# Patient Record
Sex: Female | Born: 1970 | Race: Black or African American | Hispanic: No | Marital: Married | Smoking: Never smoker
Health system: Southern US, Community
[De-identification: ages and names within clinical notes are randomized; demographics above are authoritative.]

## PROBLEM LIST (undated history)

## (undated) DIAGNOSIS — K219 Gastro-esophageal reflux disease without esophagitis: Secondary | ICD-10-CM

## (undated) DIAGNOSIS — R51 Headache: Secondary | ICD-10-CM

## (undated) DIAGNOSIS — F329 Major depressive disorder, single episode, unspecified: Secondary | ICD-10-CM

## (undated) DIAGNOSIS — F32A Depression, unspecified: Secondary | ICD-10-CM

## (undated) DIAGNOSIS — I10 Essential (primary) hypertension: Secondary | ICD-10-CM

## (undated) DIAGNOSIS — F419 Anxiety disorder, unspecified: Secondary | ICD-10-CM

## (undated) DIAGNOSIS — R519 Headache, unspecified: Secondary | ICD-10-CM

## (undated) HISTORY — PX: ABDOMINAL HYSTERECTOMY: SHX81

---

## 2001-10-25 ENCOUNTER — Emergency Department (HOSPITAL_COMMUNITY): Admission: EM | Admit: 2001-10-25 | Discharge: 2001-10-25 | Payer: Self-pay

## 2002-02-02 ENCOUNTER — Other Ambulatory Visit: Admission: RE | Admit: 2002-02-02 | Discharge: 2002-02-02 | Payer: Self-pay | Admitting: Obstetrics and Gynecology

## 2003-05-10 ENCOUNTER — Other Ambulatory Visit: Admission: RE | Admit: 2003-05-10 | Discharge: 2003-05-10 | Payer: Self-pay | Admitting: Obstetrics and Gynecology

## 2012-07-30 ENCOUNTER — Emergency Department (HOSPITAL_COMMUNITY)
Admission: EM | Admit: 2012-07-30 | Discharge: 2012-07-31 | Disposition: A | Discharge status: Home / Self Care | Payer: Managed Care, Other (non HMO) | Attending: Emergency Medicine | Admitting: Emergency Medicine

## 2012-07-30 ENCOUNTER — Encounter (HOSPITAL_COMMUNITY): Payer: Self-pay | Admitting: Emergency Medicine

## 2012-07-30 ENCOUNTER — Emergency Department (HOSPITAL_COMMUNITY): Payer: Managed Care, Other (non HMO)

## 2012-07-30 DIAGNOSIS — I10 Essential (primary) hypertension: Secondary | ICD-10-CM | POA: Insufficient documentation

## 2012-07-30 DIAGNOSIS — F329 Major depressive disorder, single episode, unspecified: Secondary | ICD-10-CM

## 2012-07-30 DIAGNOSIS — T424X1A Poisoning by benzodiazepines, accidental (unintentional), initial encounter: Secondary | ICD-10-CM

## 2012-07-30 DIAGNOSIS — T426X1A Poisoning by other antiepileptic and sedative-hypnotic drugs, accidental (unintentional), initial encounter: Secondary | ICD-10-CM | POA: Insufficient documentation

## 2012-07-30 DIAGNOSIS — T4275XA Adverse effect of unspecified antiepileptic and sedative-hypnotic drugs, initial encounter: Secondary | ICD-10-CM | POA: Insufficient documentation

## 2012-07-30 HISTORY — DX: Essential (primary) hypertension: I10

## 2012-07-30 LAB — RAPID URINE DRUG SCREEN, HOSP PERFORMED
Barbiturates: NOT DETECTED
Cocaine: NOT DETECTED
Opiates: NOT DETECTED
Tetrahydrocannabinol: NOT DETECTED

## 2012-07-30 LAB — COMPREHENSIVE METABOLIC PANEL
ALT: 14 U/L (ref 0–35)
Alkaline Phosphatase: 76 U/L (ref 39–117)
BUN: 12 mg/dL (ref 6–23)
CO2: 32 mEq/L (ref 19–32)
Calcium: 8.9 mg/dL (ref 8.4–10.5)
GFR calc Af Amer: >90 mL/min (ref >90)
GFR calc non Af Amer: >90 mL/min (ref >90)
Glucose, Bld: 81 mg/dL (ref 70–99)
Sodium: 140 mEq/L (ref 135–145)
Total Protein: 6.7 g/dL (ref 6.0–8.3)

## 2012-07-30 LAB — URINALYSIS, ROUTINE W REFLEX MICROSCOPIC
Nitrite: NEGATIVE
Specific Gravity, Urine: 1.021 (ref 1.005–1.030)
Urobilinogen, UA: 0.2 mg/dL (ref 0.0–1.0)
pH: 7 (ref 5.0–8.0)

## 2012-07-30 LAB — PREGNANCY, URINE: Preg Test, Ur: NEGATIVE

## 2012-07-30 LAB — ETHANOL: Alcohol, Ethyl (B): <11 mg/dL (ref 0–11)

## 2012-07-30 LAB — CBC
HCT: 42.1 % (ref 36.0–46.0)
Hemoglobin: 14.2 g/dL (ref 12.0–15.0)
MCH: 30.9 pg (ref 26.0–34.0)
MCHC: 33.7 g/dL (ref 30.0–36.0)
RBC: 4.59 MIL/uL (ref 3.87–5.11)

## 2012-07-30 LAB — TROPONIN I: Troponin I: <0.3 ng/mL (ref <0.30)

## 2012-07-30 MED ORDER — POTASSIUM CHLORIDE CRYS ER 20 MEQ PO TBCR
40.0000 meq | EXTENDED_RELEASE_TABLET | Freq: Once | ORAL | Status: AC
  Administered 2012-07-31: 40 meq via ORAL
  Filled 2012-07-30: qty 2
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
## 2012-07-30 NOTE — ED Notes (Signed)
YNW:GN56<OZ> Expected date:07/30/12<BR> Expected time: 8:23 PM<BR> Means of arrival:Ambulance<BR> Comments:<BR> overdose

## 2012-07-30 NOTE — ED Notes (Signed)
Pt to Radiology

## 2012-07-30 NOTE — ED Notes (Signed)
Per EMS, Pt took approximately 10, 0.5mg  Xanax. Pt stated she was not SI, only "trying to make the pain stop." Pt also finished half a bottle of wine and slept all day.  Pt's family members found her. Pt currently lethargic.

## 2012-07-31 LAB — TROPONIN I: Troponin I: <0.3 ng/mL (ref <0.30)

## 2012-07-31 MED ORDER — IBUPROFEN 200 MG PO TABS
400.0000 mg | ORAL_TABLET | Freq: Three times a day (TID) | ORAL | Status: DC | PRN
  Administered 2012-07-31: 400 mg via ORAL
  Filled 2012-07-31: qty 2

## 2012-07-31 MED ORDER — NICOTINE 21 MG/24HR TD PT24: 21.0000 mg | MEDICATED_PATCH | Freq: Every day | TRANSDERMAL | Status: DC | PRN

## 2012-07-31 MED ORDER — ACETAMINOPHEN 325 MG PO TABS: 650.0000 mg | ORAL_TABLET | ORAL | Status: DC | PRN

## 2012-07-31 MED ORDER — ONDANSETRON HCL 4 MG PO TABS: 4.0000 mg | ORAL_TABLET | Freq: Three times a day (TID) | ORAL | Status: DC | PRN

## 2012-07-31 MED ORDER — LORAZEPAM 1 MG PO TABS: 1.0000 mg | ORAL_TABLET | Freq: Three times a day (TID) | ORAL | Status: DC | PRN

## 2012-07-31 MED ORDER — MIRTAZAPINE 15 MG PO TBDP: 15.0000 mg | ORAL_TABLET | Freq: Every day | ORAL | Status: DC

## 2012-07-31 MED ORDER — ALUM & MAG HYDROXIDE-SIMETH 200-200-20 MG/5ML PO SUSP: 30.0000 mL | ORAL | Status: DC | PRN

## 2012-07-31 MED ORDER — ZOLPIDEM TARTRATE 5 MG PO TABS: 5.0000 mg | ORAL_TABLET | Freq: Every evening | ORAL | Status: DC | PRN

## 2012-07-31 NOTE — ED Notes (Signed)
Pt discharged

## 2012-07-31 NOTE — ED Provider Notes (Signed)
41 yo F, c/o OD xanax because she was "depressed."  Initially also c/o CP described as "my heart breaking." Normal EKG, normal troponin x2.  VS remain stable.  Medically cleared to go to psych holding unit.  Holding orders written.  Telepsych consult ordered.     Laray Anger, DO 07/31/12 0600

## 2012-07-31 NOTE — ED Notes (Signed)
Pt waiting for ride to arrive. Will discharge once he gets here.

## 2012-07-31 NOTE — ED Notes (Signed)
Telepsych results given to physician

## 2012-07-31 NOTE — ED Notes (Addendum)
Pt discharged home. Instructions reviewed. Pt verbalized understanding. Denies SI/HI. 

## 2012-07-31 NOTE — ED Provider Notes (Addendum)
Mary Huynh is a 41 y.o. female who is here for evaluation of a Xanax overdose. She appears depressed, she has poor insight. She appears to be a risk for discharge. Tele-psychiatric consultation has been ordered.  Flint Melter, MD 07/31/12 0915  She has been seen by Telepsych. Dr. Janalyn Rouse, states that she can be discharged with a prescription for Remeron and community mental health followup.  Flint Melter, MD 07/31/12 [phone]

## 2012-07-31 NOTE — ED Notes (Signed)
Pts belonging in locker room 41

## 2014-02-14 ENCOUNTER — Emergency Department (HOSPITAL_BASED_OUTPATIENT_CLINIC_OR_DEPARTMENT_OTHER): Payer: Managed Care, Other (non HMO)

## 2014-02-14 ENCOUNTER — Encounter (HOSPITAL_BASED_OUTPATIENT_CLINIC_OR_DEPARTMENT_OTHER): Payer: Self-pay | Admitting: Emergency Medicine

## 2014-02-14 ENCOUNTER — Emergency Department (HOSPITAL_BASED_OUTPATIENT_CLINIC_OR_DEPARTMENT_OTHER)
Admission: EM | Admit: 2014-02-14 | Discharge: 2014-02-14 | Disposition: A | Discharge status: Home / Self Care | Payer: Managed Care, Other (non HMO) | Attending: Emergency Medicine | Admitting: Emergency Medicine

## 2014-02-14 DIAGNOSIS — I10 Essential (primary) hypertension: Secondary | ICD-10-CM | POA: Insufficient documentation

## 2014-02-14 DIAGNOSIS — R079 Chest pain, unspecified: Secondary | ICD-10-CM | POA: Insufficient documentation

## 2014-02-14 DIAGNOSIS — F411 Generalized anxiety disorder: Secondary | ICD-10-CM | POA: Insufficient documentation

## 2014-02-14 DIAGNOSIS — Z79899 Other long term (current) drug therapy: Secondary | ICD-10-CM | POA: Insufficient documentation

## 2014-02-14 DIAGNOSIS — Z791 Long term (current) use of non-steroidal anti-inflammatories (NSAID): Secondary | ICD-10-CM | POA: Insufficient documentation

## 2014-02-14 DIAGNOSIS — R0602 Shortness of breath: Secondary | ICD-10-CM | POA: Insufficient documentation

## 2014-02-14 LAB — CBC WITH DIFFERENTIAL/PLATELET
Basophils Absolute: 0 10*3/uL (ref 0.0–0.1)
Basophils Relative: 1 % (ref 0–1)
Eosinophils Absolute: 0.1 10*3/uL (ref 0.0–0.7)
Eosinophils Relative: 2 % (ref 0–5)
HCT: 43.4 % (ref 36.0–46.0)
Hemoglobin: 14.8 g/dL (ref 12.0–15.0)
Lymphocytes Relative: 37 % (ref 12–46)
Lymphs Abs: 1.8 10*3/uL (ref 0.7–4.0)
MCH: 31.6 pg (ref 26.0–34.0)
MCHC: 34.1 g/dL (ref 30.0–36.0)
MCV: 92.7 fL (ref 78.0–100.0)
Monocytes Absolute: 0.5 10*3/uL (ref 0.1–1.0)
Monocytes Relative: 9 % (ref 3–12)
Neutro Abs: 2.5 10*3/uL (ref 1.7–7.7)
Neutrophils Relative %: 52 % (ref 43–77)
Platelets: 204 10*3/uL (ref 150–400)
RBC: 4.68 MIL/uL (ref 3.87–5.11)
RDW: 13 % (ref 11.5–15.5)
WBC: 4.9 10*3/uL (ref 4.0–10.5)

## 2014-02-14 LAB — BASIC METABOLIC PANEL
BUN: 17 mg/dL (ref 6–23)
CHLORIDE: 99 meq/L (ref 96–112)
CO2: 28 meq/L (ref 19–32)
CREATININE: 0.8 mg/dL (ref 0.50–1.10)
Calcium: 9.3 mg/dL (ref 8.4–10.5)
GFR calc Af Amer: >90 mL/min (ref >90)
GFR calc non Af Amer: >90 mL/min (ref >90)
GLUCOSE: 95 mg/dL (ref 70–99)
Potassium: 3.3 mEq/L — ABNORMAL LOW (ref 3.7–5.3)
Sodium: 141 mEq/L (ref 137–147)

## 2014-02-14 LAB — TROPONIN I: Troponin I: <0.3 ng/mL (ref <0.30)

## 2014-02-14 LAB — D-DIMER, QUANTITATIVE: D-Dimer, Quant: 0.32 ug/mL-FEU (ref 0.00–0.48)

## 2014-02-14 MED ORDER — MORPHINE SULFATE 4 MG/ML IJ SOLN
4.0000 mg | Freq: Once | INTRAMUSCULAR | Status: AC
  Administered 2014-02-14: 4 mg via INTRAVENOUS
  Filled 2014-02-14: qty 1

## 2014-02-14 MED ORDER — HYDROCODONE-ACETAMINOPHEN 5-325 MG PO TABS: 1.0000 | ORAL_TABLET | ORAL | Status: DC | PRN

## 2014-02-14 MED ORDER — ONDANSETRON HCL 4 MG/2ML IJ SOLN
4.0000 mg | Freq: Once | INTRAMUSCULAR | Status: AC
  Administered 2014-02-14: 4 mg via INTRAVENOUS
  Filled 2014-02-14: qty 2

## 2014-02-14 NOTE — ED Notes (Signed)
Pt amb to room 1 with quick steady gait in nad. Pt reports left chest, shoulder and arm have been "sore". Area is tender to palp, pain increases with movements, deep inspiration and positioning, ekg in progress while pt being triaged.

## 2014-02-14 NOTE — ED Provider Notes (Signed)
CSN: [id number]     Arrival date & time 02/14/14  1112 History   First MD Initiated Contact with Patient 02/14/14 1116     Chief Complaint  Patient presents with  . chest soreness      (Consider location/radiation/quality/duration/timing/severity/associated sxs/prior Treatment) HPI Comments: Pt states that she has had left sided cp without radiation for the last 4 days. Worse with movement and inspiration. Felt a little sob this morning. Pt states that she has similar episode about 3 years ago and she was diagnosed with anxiety after having a stress test. No vomiting, cough, diaphoresis. Hasn't taken any medications for this  The history is provided by the patient. No language interpreter was used.    Past Medical History  Diagnosis Date  . Hypertension    Past Surgical History  Procedure Laterality Date  . Abdominal hysterectomy     History reviewed. No pertinent family history. History  Substance Use Topics  . Smoking status: Never Smoker   . Smokeless tobacco: Never Used  . Alcohol Use: Yes     Comment: occasionally   OB History   Grav Para Term Preterm Abortions TAB SAB Ect Mult Living                 Review of Systems  Constitutional: Negative.   Respiratory: Positive for chest tightness.   Cardiovascular: Positive for chest pain.      Allergies  Review of patient's allergies indicates no known allergies.  Home Medications   Prior to Admission medications   Medication Sig Start Date End Date Taking? Authorizing Provider  ALPRAZolam Prudy Feeler) 0.5 MG tablet Take 0.5 mg by mouth at bedtime as needed. Anxiety    Historical Provider, MD  cyclobenzaprine (FLEXERIL) 10 MG tablet Take 10 mg by mouth 3 (three) times daily.    Historical Provider, MD  enalapril-hydrochlorothiazide (VASERETIC) 10-25 MG per tablet Take 1 tablet by mouth daily.    Historical Provider, MD  mirtazapine (REMERON SOLTAB) 15 MG disintegrating tablet Take 1 tablet (15 mg total) by mouth at  bedtime. 07/31/12   Flint Melter, MD  naproxen (NAPROSYN) 500 MG tablet Take 500 mg by mouth 2 (two) times daily with a meal.    Historical Provider, MD  phentermine 37.5 MG capsule Take 37.5 mg by mouth every morning.    Historical Provider, MD  tiZANidine (ZANAFLEX) 4 MG tablet Take 4 mg by mouth 3 (three) times daily.    Historical Provider, MD  Vitamin D, Ergocalciferol, (DRISDOL) 50000 UNITS CAPS Take 50,000 Units by mouth every 7 (seven) days.    Historical Provider, MD   BP 117/75  Pulse 80  Temp(Src) 98.4 F (36.9 C) (Oral)  Resp 18  Ht 5\' 8"  (1.727 m)  Wt 255 lb (115.667 kg)  BMI 38.78 kg/m2  SpO2 100% Physical Exam  Nursing note and vitals reviewed. Constitutional: She is oriented to person, place, and time. She appears well-developed and well-nourished.  HENT:  Head: Normocephalic and atraumatic.  Cardiovascular: Normal rate and regular rhythm.   Pulmonary/Chest: Effort normal.  Reproducible left sided cp  Neurological: She is alert and oriented to person, place, and time.  Skin: Skin is warm and dry.  Psychiatric: She has a normal mood and affect.    ED Course  Procedures (including critical care time) Labs Review Labs Reviewed  BASIC METABOLIC PANEL - Abnormal; Notable for the following:    Potassium 3.3 (*)    All other components within normal limits  CBC WITH  DIFFERENTIAL  TROPONIN I  D-DIMER, QUANTITATIVE    Imaging Review Dg Chest 2 View  02/14/2014   CLINICAL DATA:  Chest pain.  Shortness of breath.  EXAM: CHEST  2 VIEW  COMPARISON:  07/30/2012.  FINDINGS: Mediastinum hilar structures normal. Lungs are clear. Heart size normal. Stable mild elevation left hemidiaphragm. No acute bony abnormality.  IMPRESSION: No active cardiopulmonary disease.   Electronically Signed   By: Maisie Fushomas  Register   On: 02/14/2014 12:29     EKG Interpretation   Date/Time:  Monday February 14 2014 11:23:44 EDT Ventricular Rate:  80 PR Interval:  154 QRS Duration: 96 QT  Interval:  324 QTC Calculation: 373 R Axis:   -10 Text Interpretation:  Normal sinus rhythm Nonspecific T wave abnormality  Abnormal ECG No significant change was found Confirmed by [facility]  MD,  TREY ([id number]) on 02/14/2014 1:59:09 PM      MDM   Final diagnoses:  Chest pain    Pt states that she is no longer having pain and it just sore. Will trop negative. With symptoms going on this long don't think a second one needs to be done. Pt is okay to follow up with pcp for continued symptoms    Teressa LowerVrinda Sherlock Nancarrow, NP 02/14/14 1415

## 2014-02-14 NOTE — Discharge Instructions (Signed)
Chest Pain (Nonspecific) °It is often hard to give a specific diagnosis for the cause of chest pain. There is always a chance that your pain could be related to something serious, such as a heart attack or a blood clot in the lungs. You need to follow up with your caregiver for further evaluation. °CAUSES  °· Heartburn. °· Pneumonia or bronchitis. °· Anxiety or stress. °· Inflammation around your heart (pericarditis) or lung (pleuritis or pleurisy). °· A blood clot in the lung. °· A collapsed lung (pneumothorax). It can develop suddenly on its own (spontaneous pneumothorax) or from injury (trauma) to the chest. °· Shingles infection (herpes zoster virus). °The chest wall is composed of bones, muscles, and cartilage. Any of these can be the source of the pain. °· The bones can be bruised by injury. °· The muscles or cartilage can be strained by coughing or overwork. °· The cartilage can be affected by inflammation and become sore (costochondritis). °DIAGNOSIS  °Lab tests or other studies, such as X-rays, electrocardiography, stress testing, or cardiac imaging, may be needed to find the cause of your pain.  °TREATMENT  °· Treatment depends on what may be causing your chest pain. Treatment may include: °· Acid blockers for heartburn. °· Anti-inflammatory medicine. °· Pain medicine for inflammatory conditions. °· Antibiotics if an infection is present. °· You may be advised to change lifestyle habits. This includes stopping smoking and avoiding alcohol, caffeine, and chocolate. °· You may be advised to keep your head raised (elevated) when sleeping. This reduces the chance of acid going backward from your stomach into your esophagus. °· Most of the time, nonspecific chest pain will improve within 2 to 3 days with rest and mild pain medicine. °HOME CARE INSTRUCTIONS  °· If antibiotics were prescribed, take your antibiotics as directed. Finish them even if you start to feel better. °· For the next few days, avoid physical  activities that bring on chest pain. Continue physical activities as directed. °· Do not smoke. °· Avoid drinking alcohol. °· Only take over-the-counter or prescription medicine for pain, discomfort, or fever as directed by your caregiver. °· Follow your caregiver's suggestions for further testing if your chest pain does not go away. °· Keep any follow-up appointments you made. If you do not go to an appointment, you could develop lasting (chronic) problems with pain. If there is any problem keeping an appointment, you must call to reschedule. °SEEK MEDICAL CARE IF:  °· You think you are having problems from the medicine you are taking. Read your medicine instructions carefully. °· Your chest pain does not go away, even after treatment. °· You develop a rash with blisters on your chest. °SEEK IMMEDIATE MEDICAL CARE IF:  °· You have increased chest pain or pain that spreads to your arm, neck, jaw, back, or abdomen. °· You develop shortness of breath, an increasing cough, or you are coughing up blood. °· You have severe back or abdominal pain, feel nauseous, or vomit. °· You develop severe weakness, fainting, or chills. °· You have a fever. °THIS IS AN EMERGENCY. Do not wait to see if the pain will go away. Get medical help at once. Call your local emergency services (911 in [place] Do not drive yourself to the hospital. °MAKE SURE YOU:  °· Understand these instructions. °· Will watch your condition. °· Will get help right away if you are not doing well or get worse. °Document Released: 06/12/2005 Document Revised: 11/25/2011 Document Reviewed: 04/07/2008 °ExitCare® Patient Information ©2014 [employer]   [employer]. ° °

## 2014-02-17 NOTE — ED Provider Notes (Signed)
Medical screening examination/treatment/procedure(s) were performed by non-physician practitioner and as supervising physician I was immediately available for consultation/collaboration.   EKG Interpretation   Date/Time:  Monday February 14 2014 11:23:44 EDT Ventricular Rate:  80 PR Interval:  154 QRS Duration: 96 QT Interval:  324 QTC Calculation: 373 R Axis:   -10 Text Interpretation:  Normal sinus rhythm Nonspecific T wave abnormality  Abnormal ECG No significant change was found Confirmed by [facility]  MD,  TREY ([id number]) on 02/14/2014 1:59:09 PM        Jonny Ruiz Young Berry III, MD 02/17/14 1114

## 2016-07-24 ENCOUNTER — Other Ambulatory Visit: Payer: Self-pay | Admitting: Surgery

## 2016-08-21 ENCOUNTER — Encounter (HOSPITAL_COMMUNITY)
Admission: RE | Admit: 2016-08-21 | Discharge: 2016-08-21 | Disposition: A | Discharge status: Home / Self Care | Payer: Managed Care, Other (non HMO) | Source: Ambulatory Visit | Attending: Surgery | Admitting: Surgery

## 2016-08-21 ENCOUNTER — Encounter (HOSPITAL_COMMUNITY): Payer: Self-pay

## 2016-08-21 DIAGNOSIS — Z01812 Encounter for preprocedural laboratory examination: Secondary | ICD-10-CM | POA: Insufficient documentation

## 2016-08-21 DIAGNOSIS — K811 Chronic cholecystitis: Secondary | ICD-10-CM | POA: Diagnosis not present

## 2016-08-21 DIAGNOSIS — Z01810 Encounter for preprocedural cardiovascular examination: Secondary | ICD-10-CM | POA: Diagnosis present

## 2016-08-21 HISTORY — DX: Gastro-esophageal reflux disease without esophagitis: K21.9

## 2016-08-21 HISTORY — DX: Depression, unspecified: F32.A

## 2016-08-21 HISTORY — DX: Headache, unspecified: R51.9

## 2016-08-21 HISTORY — DX: Anxiety disorder, unspecified: F41.9

## 2016-08-21 HISTORY — DX: Headache: R51

## 2016-08-21 HISTORY — DX: Major depressive disorder, single episode, unspecified: F32.9

## 2016-08-21 LAB — BASIC METABOLIC PANEL
Anion gap: 8 (ref 5–15)
BUN: 9 mg/dL (ref 6–20)
CHLORIDE: 103 mmol/L (ref 101–111)
CO2: 27 mmol/L (ref 22–32)
CREATININE: 0.69 mg/dL (ref 0.44–1.00)
Calcium: 8.7 mg/dL — ABNORMAL LOW (ref 8.9–10.3)
Glucose, Bld: 88 mg/dL (ref 65–99)
POTASSIUM: 3.1 mmol/L — AB (ref 3.5–5.1)
Sodium: 138 mmol/L (ref 135–145)

## 2016-08-21 LAB — CBC
HCT: 39.6 % (ref 36.0–46.0)
HEMOGLOBIN: 13.2 g/dL (ref 12.0–15.0)
MCH: 30.3 pg (ref 26.0–34.0)
MCHC: 33.3 g/dL (ref 30.0–36.0)
MCV: 91 fL (ref 78.0–100.0)
PLATELETS: 206 10*3/uL (ref 150–400)
RBC: 4.35 MIL/uL (ref 3.87–5.11)
RDW: 13.6 % (ref 11.5–15.5)
WBC: 4.2 10*3/uL (ref 4.0–10.5)

## 2016-08-21 NOTE — Progress Notes (Signed)
[facility] , [facility], [facility]   [phone].

## 2016-08-21 NOTE — Pre-Procedure Instructions (Signed)
Mary Huynh  08/21/2016      [facility] [id number] - [place], [place] - [address] AT [facility] [address] [place] [id number] Phone: [phone] Fax: [phone]  [facility] - [place], [place] - [address] [place] [id number] Phone: [phone] Fax: [phone]    Your procedure is scheduled on   Monday  08/26/16  [facility]  Call this number if you have problems the morning of surgery:  [phone]   Remember:  Do not eat food or drink liquids after midnight.  Take these medicines the morning of surgery with A SIP OF WATER    Estradiol, eye drops, amitiza, paroxetine (paxil), tramadol if needed    (stop  Now  Taking aspirin or aspirin products, ibuprofen/ advil/ motrin, goody powders, bc's, herbal medicines)   Do not wear jewelry, make-up or nail polish.  Do not wear lotions, powders, or perfumes, or deoderant.  Do not shave 48 hours prior to surgery.  Men may shave face and neck.  Do not bring valuables to the hospital.  [facility] is not responsible for any belongings or valuables.  Contacts, dentures or bridgework may not be worn into surgery.  Leave your suitcase in the car.  After surgery it may be brought to your room.  For patients admitted to the hospital, discharge time will be determined by your treatment team.  Patients discharged the day of surgery will not be allowed to drive home.   Name and phone number of your driver:    Special instructions:  [place] - Preparing for Surgery  Before surgery, you can play an important role.  Because skin is not sterile, your skin needs to be as free of germs as possible.  You can reduce the number of germs on you skin by washing with CHG (chlorahexidine gluconate) soap before surgery.  CHG is an antiseptic cleaner which kills germs and bonds with the skin to continue killing germs even after  washing.  Please DO NOT use if you have an allergy to CHG or antibacterial soaps.  If your skin becomes reddened/irritated stop using the CHG and inform your nurse when you arrive at Short Stay.  Do not shave (including legs and underarms) for at least 48 hours prior to the first CHG shower.  You may shave your face.  Please follow these instructions carefully:   1.  Shower with CHG Soap the night before surgery and the                                morning of Surgery.  2.  If you choose to wash your hair, wash your hair first as usual with your       normal shampoo.  3.  After you shampoo, rinse your hair and body thoroughly to remove the                      Shampoo.  4.  Use CHG as you would any other liquid soap.  You can apply chg directly       to the skin and wash gently with scrungie or a clean washcloth.  5.  Apply the CHG Soap to your body ONLY FROM THE NECK DOWN.  Do not use on open wounds or open sores.  Avoid contact with your eyes,       ears, mouth and genitals (private parts).  Wash genitals (private parts)       with your normal soap.  6.  Wash thoroughly, paying special attention to the area where your surgery        will be performed.  7.  Thoroughly rinse your body with warm water from the neck down.  8.  DO NOT shower/wash with your normal soap after using and rinsing off       the CHG Soap.  9.  Pat yourself dry with a clean towel.            10.  Wear clean pajamas.            11.  Place clean sheets on your bed the night of your first shower and do not        sleep with pets.  Day of Surgery  Do not apply any lotions/deoderants the morning of surgery.  Please wear clean clothes to the hospital/surgery center.    Please read over the following fact sheets that you were given. Pain Booklet

## 2016-08-23 MED ORDER — DEXTROSE 5 % IV SOLN
3.0000 g | INTRAVENOUS | Status: AC
  Administered 2016-08-26: 3 g via INTRAVENOUS
  Filled 2016-08-23: qty 3000

## 2016-08-25 NOTE — H&P (Signed)
Mary Huynh  Location: [facility] Patient #: [id number] DOB: 05/11/1971 Divorced / Language: English / Race: Black or African American Female   History of Present Illness  The patient is a 45 year old female who presents with abdominal pain. this is a pleasant female referred to me by Dr. Charna ElizabethJyothi Mann for a several month history of right upper quadrant abdominal pain with nausea and vomiting. This is been occurring almost daily after fatty meals. She had a HIDA scan showing a slightly greater than 50% gallbladder ejection fraction. Her symptoms were reproduced with Ensure during the study per her report. She had an upper endoscopy which is unremarkable. She had several episodes of chest pain over the past several years which she could've been her gallbladder. She has been otherwise healthy without complaints. Bowel movements are normal.   Other Problems  Anxiety Disorder  Depression  Gastroesophageal Reflux Disease  High blood pressure  Migraine Headache  Oophorectomy   Past Surgical History  Hysterectomy (not due to cancer) - Complete  Knee Surgery  Bilateral. Tonsillectomy   Diagnostic Studies History Colonoscopy  never Mammogram  within last year Pap Smear  1-5 years ago  Allergies No Known Drug Allergies 07/24/2016  Medication History  Vitamin D (Cholecalciferol) (1000UNIT Capsule, Oral) Active. TraMADol HCl (50MG  Tablet, Oral) Active. Magnesium (200MG  Tablet, Oral) Active. Amitiza (24MCG Capsule, Oral) Active. Medications Reconciled  Social History Alcohol use  Occasional alcohol use. Caffeine use  Tea. No drug use  Tobacco use  Never smoker.  Family History  Alcohol Abuse  Father, Sister. Arthritis  Mother. Depression  Mother. Heart Disease  Mother. Heart disease in female family member before age 43  Hypertension  Father, Mother.  Pregnancy / Birth History  Age at menarche  14 years. Age of  menopause  <47 Contraceptive History  Oral contraceptives. Gravida  0 Para  0 Regular periods     Review of Systems  General Not Present- Appetite Loss, Chills, Fatigue, Fever, Night Sweats, Weight Gain and Weight Loss. Skin Present- Dryness. Not Present- Change in Wart/Mole, Hives, Jaundice, New Lesions, Non-Healing Wounds, Rash and Ulcer. HEENT Present- Seasonal Allergies and Wears glasses/contact lenses. Not Present- Earache, Hearing Loss, Hoarseness, Nose Bleed, Oral Ulcers, Ringing in the Ears, Sinus Pain, Sore Throat, Visual Disturbances and Yellow Eyes. Respiratory Present- Snoring. Not Present- Bloody sputum, Chronic Cough, Difficulty Breathing and Wheezing. Breast Not Present- Breast Mass, Breast Pain, Nipple Discharge and Skin Changes. Cardiovascular Present- Swelling of Extremities. Not Present- Chest Pain, Difficulty Breathing Lying Down, Leg Cramps, Palpitations, Rapid Heart Rate and Shortness of Breath. Gastrointestinal Present- Abdominal Pain, Bloating and Nausea. Not Present- Bloody Stool, Change in Bowel Habits, Chronic diarrhea, Constipation, Difficulty Swallowing, Excessive gas, Gets full quickly at meals, Hemorrhoids, Indigestion, Rectal Pain and Vomiting. Female Genitourinary Not Present- Frequency, Nocturia, Painful Urination, Pelvic Pain and Urgency. Musculoskeletal Present- Back Pain. Not Present- Joint Pain, Joint Stiffness, Muscle Pain, Muscle Weakness and Swelling of Extremities. Neurological Not Present- Decreased Memory, Fainting, Headaches, Numbness, Seizures, Tingling, Tremor, Trouble walking and Weakness. Psychiatric Present- Anxiety and Depression. Not Present- Bipolar, Change in Sleep Pattern, Fearful and Frequent crying. Endocrine Not Present- Cold Intolerance, Excessive Hunger, Hair Changes, Heat Intolerance, Hot flashes and New Diabetes. Hematology Not Present- Blood Thinners, Easy Bruising, Excessive bleeding, Gland problems, HIV and Persistent  Infections.  Vitals  07/24/2016 4:08 PM Weight: 288 lb Height: 68in Body Surface Area: 2.39 m Body Mass Index: 43.79 kg/m  Temp.: 97.12F(Temporal)  Pulse: 81 (Regular)  BP: 126/77 (Sitting, Left Arm, Standard)   Physical Exam  General Mental Status-Alert. General Appearance-Consistent with stated age. Hydration-Well hydrated. Voice-Normal.  Head and Neck Head-normocephalic, atraumatic with no lesions or palpable masses.  Eye Eyeball - Bilateral-Extraocular movements intact. Sclera/Conjunctiva - Bilateral-No scleral icterus.  Chest and Lung Exam Chest and lung exam reveals -quiet, even and easy respiratory effort with no use of accessory muscles and on auscultation, normal breath sounds, no adventitious sounds and normal vocal resonance. Inspection Chest Wall - Normal. Back - normal.  Cardiovascular Cardiovascular examination reveals -on palpation PMI is normal in location and amplitude, no palpable S3 or S4. Normal cardiac borders., normal heart sounds, regular rate and rhythm with no murmurs, carotid auscultation reveals no bruits and normal pedal pulses bilaterally.  Abdomen Inspection Inspection of the abdomen reveals - No Hernias. Skin - Scar - no surgical scars. Palpation/Percussion Palpation and Percussion of the abdomen reveal - Soft, No Rebound tenderness, No Rigidity (guarding) and No hepatosplenomegaly. Tenderness - Right Upper Quadrant. Auscultation Auscultation of the abdomen reveals - Bowel sounds normal.  Neurologic - Did not examine.  Musculoskeletal - Did not examine.    Assessment & Plan   CHRONIC CHOLECYSTITIS (K81.1)  Impression: I believe she has biliary dyskinesia with chronic cholecystitis based on her symptoms and physical examination. I discussed this with her in detail. I discussed continued expectant management versus laparoscopic cholecystectomy. I discussed the surgical procedure in detail. I discussed the  risk of surgery which includes but is not limited to bleeding, infection, injury to surrounding structures, bile leak, the need to convert to an open procedure, the chance this may not resolve her symptoms, postoperative recovery, etc. She understands and wishes to proceed with surgery which will be scheduled

## 2016-08-26 ENCOUNTER — Encounter (HOSPITAL_COMMUNITY): Payer: Self-pay | Admitting: *Deleted

## 2016-08-26 ENCOUNTER — Ambulatory Visit (HOSPITAL_COMMUNITY)
Admission: RE | Admit: 2016-08-26 | Discharge: 2016-08-26 | Disposition: A | Discharge status: Home / Self Care | Payer: Managed Care, Other (non HMO) | Source: Ambulatory Visit | Attending: Surgery | Admitting: Surgery

## 2016-08-26 ENCOUNTER — Ambulatory Visit (HOSPITAL_COMMUNITY): Payer: Managed Care, Other (non HMO) | Admitting: Anesthesiology

## 2016-08-26 ENCOUNTER — Encounter (HOSPITAL_COMMUNITY)
Admission: RE | Disposition: A | Discharge status: Home / Self Care | Payer: Self-pay | Source: Ambulatory Visit | Attending: Surgery

## 2016-08-26 DIAGNOSIS — Z90710 Acquired absence of both cervix and uterus: Secondary | ICD-10-CM | POA: Insufficient documentation

## 2016-08-26 DIAGNOSIS — I10 Essential (primary) hypertension: Secondary | ICD-10-CM | POA: Insufficient documentation

## 2016-08-26 DIAGNOSIS — F329 Major depressive disorder, single episode, unspecified: Secondary | ICD-10-CM | POA: Diagnosis not present

## 2016-08-26 DIAGNOSIS — K811 Chronic cholecystitis: Secondary | ICD-10-CM | POA: Insufficient documentation

## 2016-08-26 DIAGNOSIS — Z6841 Body Mass Index (BMI) 40.0 and over, adult: Secondary | ICD-10-CM | POA: Diagnosis not present

## 2016-08-26 DIAGNOSIS — K219 Gastro-esophageal reflux disease without esophagitis: Secondary | ICD-10-CM | POA: Insufficient documentation

## 2016-08-26 DIAGNOSIS — F419 Anxiety disorder, unspecified: Secondary | ICD-10-CM | POA: Insufficient documentation

## 2016-08-26 DIAGNOSIS — K828 Other specified diseases of gallbladder: Secondary | ICD-10-CM | POA: Insufficient documentation

## 2016-08-26 HISTORY — PX: CHOLECYSTECTOMY: SHX55

## 2016-08-26 SURGERY — LAPAROSCOPIC CHOLECYSTECTOMY
Anesthesia: General | Site: Abdomen

## 2016-08-26 MED ORDER — LIDOCAINE 2% (20 MG/ML) 5 ML SYRINGE
INTRAMUSCULAR | Status: AC
  Filled 2016-08-26: qty 5

## 2016-08-26 MED ORDER — PROPOFOL 10 MG/ML IV BOLUS
INTRAVENOUS | Status: AC
  Filled 2016-08-26: qty 20

## 2016-08-26 MED ORDER — FENTANYL CITRATE (PF) 100 MCG/2ML IJ SOLN
INTRAMUSCULAR | Status: DC | PRN
  Administered 2016-08-26: 100 ug via INTRAVENOUS

## 2016-08-26 MED ORDER — BUPIVACAINE-EPINEPHRINE 0.25% -1:200000 IJ SOLN
INTRAMUSCULAR | Status: DC | PRN
  Administered 2016-08-26: 20 mL

## 2016-08-26 MED ORDER — FENTANYL CITRATE (PF) 100 MCG/2ML IJ SOLN
INTRAMUSCULAR | Status: AC
  Administered 2016-08-26: 50 ug via INTRAVENOUS
  Filled 2016-08-26: qty 2

## 2016-08-26 MED ORDER — FENTANYL CITRATE (PF) 100 MCG/2ML IJ SOLN
INTRAMUSCULAR | Status: AC
  Filled 2016-08-26: qty 2

## 2016-08-26 MED ORDER — ONDANSETRON HCL 4 MG/2ML IJ SOLN
INTRAMUSCULAR | Status: DC | PRN
  Administered 2016-08-26: 4 mg via INTRAVENOUS

## 2016-08-26 MED ORDER — SODIUM CHLORIDE 0.9 % IR SOLN
Status: DC | PRN
  Administered 2016-08-26: 1000 mL

## 2016-08-26 MED ORDER — CHLORHEXIDINE GLUCONATE CLOTH 2 % EX PADS: 6.0000 | MEDICATED_PAD | Freq: Once | CUTANEOUS | Status: DC

## 2016-08-26 MED ORDER — OXYCODONE HCL 5 MG PO TABS: 5.0000 mg | ORAL_TABLET | ORAL | Status: DC | PRN

## 2016-08-26 MED ORDER — ROCURONIUM BROMIDE 50 MG/5ML IV SOSY
PREFILLED_SYRINGE | INTRAVENOUS | Status: AC
  Filled 2016-08-26: qty 5

## 2016-08-26 MED ORDER — KETOROLAC TROMETHAMINE 30 MG/ML IJ SOLN
INTRAMUSCULAR | Status: DC | PRN
  Administered 2016-08-26: 30 mg via INTRAVENOUS

## 2016-08-26 MED ORDER — LIDOCAINE HCL (CARDIAC) 20 MG/ML IV SOLN
INTRAVENOUS | Status: DC | PRN
  Administered 2016-08-26: 100 mg via INTRAVENOUS

## 2016-08-26 MED ORDER — PROPOFOL 10 MG/ML IV BOLUS
INTRAVENOUS | Status: DC | PRN
  Administered 2016-08-26: 200 mg via INTRAVENOUS

## 2016-08-26 MED ORDER — MIDAZOLAM HCL 2 MG/2ML IJ SOLN
INTRAMUSCULAR | Status: AC
  Filled 2016-08-26: qty 2

## 2016-08-26 MED ORDER — 0.9 % SODIUM CHLORIDE (POUR BTL) OPTIME
TOPICAL | Status: DC | PRN
  Administered 2016-08-26: 1000 mL

## 2016-08-26 MED ORDER — OXYCODONE-ACETAMINOPHEN 5-325 MG PO TABS: 1.0000 | ORAL_TABLET | ORAL | 0 refills | Status: AC | PRN

## 2016-08-26 MED ORDER — PHENYLEPHRINE HCL 10 MG/ML IJ SOLN
INTRAMUSCULAR | Status: DC | PRN
  Administered 2016-08-26: 80 ug via INTRAVENOUS

## 2016-08-26 MED ORDER — FENTANYL CITRATE (PF) 100 MCG/2ML IJ SOLN
25.0000 ug | INTRAMUSCULAR | Status: DC | PRN
  Administered 2016-08-26 (×2): 50 ug via INTRAVENOUS

## 2016-08-26 MED ORDER — ROCURONIUM BROMIDE 100 MG/10ML IV SOLN
INTRAVENOUS | Status: DC | PRN
  Administered 2016-08-26 (×3): 10 mg via INTRAVENOUS
  Administered 2016-08-26: 30 mg via INTRAVENOUS

## 2016-08-26 MED ORDER — BUPIVACAINE-EPINEPHRINE (PF) 0.25% -1:200000 IJ SOLN
INTRAMUSCULAR | Status: AC
  Filled 2016-08-26: qty 30

## 2016-08-26 MED ORDER — MIDAZOLAM HCL 5 MG/5ML IJ SOLN
INTRAMUSCULAR | Status: DC | PRN
  Administered 2016-08-26: 2 mg via INTRAVENOUS

## 2016-08-26 MED ORDER — LACTATED RINGERS IV SOLN
INTRAVENOUS | Status: DC
  Administered 2016-08-26 (×2): via INTRAVENOUS

## 2016-08-26 SURGICAL SUPPLY — 36 items
ADH SKN CLS APL DERMABOND .7 (GAUZE/BANDAGES/DRESSINGS) ×1
APPLIER CLIP 5 13 M/L LIGAMAX5 (MISCELLANEOUS) ×3
APR CLP MED LRG 5 ANG JAW (MISCELLANEOUS) ×1
BAG SPEC RTRVL LRG 6X4 10 (ENDOMECHANICALS) ×1
CANISTER SUCTION 2500CC (MISCELLANEOUS) ×3 IMPLANT
CHLORAPREP W/TINT 26ML (MISCELLANEOUS) ×3 IMPLANT
CLIP APPLIE 5 13 M/L LIGAMAX5 (MISCELLANEOUS) ×1 IMPLANT
COVER SURGICAL LIGHT HANDLE (MISCELLANEOUS) ×3 IMPLANT
DECANTER SPIKE VIAL GLASS SM (MISCELLANEOUS) ×2 IMPLANT
DERMABOND ADVANCED (GAUZE/BANDAGES/DRESSINGS) ×2
DERMABOND ADVANCED .7 DNX12 (GAUZE/BANDAGES/DRESSINGS) ×1 IMPLANT
ELECT REM PT RETURN 9FT ADLT (ELECTROSURGICAL) ×3
ELECTRODE REM PT RTRN 9FT ADLT (ELECTROSURGICAL) ×1 IMPLANT
GLOVE ECLIPSE 6.5 STRL STRAW (GLOVE) ×2 IMPLANT
GLOVE INDICATOR 7.0 STRL GRN (GLOVE) ×2 IMPLANT
GLOVE SURG SIGNA 7.5 PF LTX (GLOVE) ×3 IMPLANT
GOWN STRL REUS W/ TWL LRG LVL3 (GOWN DISPOSABLE) ×2 IMPLANT
GOWN STRL REUS W/ TWL XL LVL3 (GOWN DISPOSABLE) ×1 IMPLANT
GOWN STRL REUS W/TWL LRG LVL3 (GOWN DISPOSABLE) ×3
GOWN STRL REUS W/TWL XL LVL3 (GOWN DISPOSABLE) ×6
KIT BASIN OR (CUSTOM PROCEDURE TRAY) ×3 IMPLANT
KIT ROOM TURNOVER OR (KITS) ×3 IMPLANT
NS IRRIG 1000ML POUR BTL (IV SOLUTION) ×3 IMPLANT
PAD ARMBOARD 7.5X6 YLW CONV (MISCELLANEOUS) ×3 IMPLANT
POUCH SPECIMEN RETRIEVAL 10MM (ENDOMECHANICALS) ×3 IMPLANT
SCISSORS LAP 5X35 DISP (ENDOMECHANICALS) ×3 IMPLANT
SET IRRIG TUBING LAPAROSCOPIC (IRRIGATION / IRRIGATOR) ×3 IMPLANT
SLEEVE ENDOPATH XCEL 5M (ENDOMECHANICALS) ×6 IMPLANT
SPECIMEN JAR SMALL (MISCELLANEOUS) ×3 IMPLANT
SUT MON AB 4-0 PC3 18 (SUTURE) ×3 IMPLANT
TOWEL OR 17X24 6PK STRL BLUE (TOWEL DISPOSABLE) ×3 IMPLANT
TOWEL OR 17X26 10 PK STRL BLUE (TOWEL DISPOSABLE) ×3 IMPLANT
TRAY LAPAROSCOPIC MC (CUSTOM PROCEDURE TRAY) ×3 IMPLANT
TROCAR XCEL BLUNT TIP 100MML (ENDOMECHANICALS) ×3 IMPLANT
TROCAR XCEL NON-BLD 5MMX100MML (ENDOMECHANICALS) ×3 IMPLANT
TUBING INSUFFLATION (TUBING) ×3 IMPLANT

## 2016-08-26 NOTE — Interval H&P Note (Signed)
History and Physical Interval Note: no change in H and P  08/26/2016 10:48 AM  Mary Huynh  has presented today for surgery, with the diagnosis of chronic cholecysitis  The various methods of treatment have been discussed with the patient and family. After consideration of risks, benefits and other options for treatment, the patient has consented to  Procedure(s): LAPAROSCOPIC CHOLECYSTECTOMY (N/A) as a surgical intervention .  The patient's history has been reviewed, patient examined, no change in status, stable for surgery.  I have reviewed the patient's chart and labs.  Questions were answered to the patient's satisfaction.     Alisen Marsiglia A

## 2016-08-26 NOTE — Discharge Instructions (Signed)
[facility] ______[facility] LAPAROSCOPIC SURGERY: POST OP INSTRUCTIONS Always review your discharge instruction sheet given to you by the facility where your surgery was performed. IF YOU HAVE DISABILITY OR FAMILY LEAVE FORMS, YOU MUST BRING THEM TO THE OFFICE FOR PROCESSING.   DO NOT GIVE THEM TO YOUR DOCTOR.  1. A prescription for pain medication may be given to you upon discharge.  Take your pain medication as prescribed, if needed.  If narcotic pain medicine is not needed, then you may take acetaminophen (Tylenol) or ibuprofen (Advil) as needed. 2. Take your usually prescribed medications unless otherwise directed. 3. If you need a refill on your pain medication, please contact your pharmacy.  They will contact our office to request authorization. Prescriptions will not be filled after 5pm or on week-ends. 4. You should follow a light diet the first few days after arrival home, such as soup and crackers, etc.  Be sure to include lots of fluids daily. 5. Most patients will experience some swelling and bruising in the area of the incisions.  Ice packs will help.  Swelling and bruising can take several days to resolve.  6. It is common to experience some constipation if taking pain medication after surgery.  Increasing fluid intake and taking a stool softener (such as Colace) will usually help or prevent this problem from occurring.  A mild laxative (Milk of Magnesia or Miralax) should be taken according to package instructions if there are no bowel movements after 48 hours. 7. Unless discharge instructions indicate otherwise, you may remove your bandages 24-48 hours after surgery, and you may shower at that time.  You may have steri-strips (small skin tapes) in place directly over the incision.  These strips should be left on the skin for 7-10 days.  If your surgeon used skin glue on the incision, you may shower in 24 hours.  The glue will flake off over the next 2-3 weeks.  Any sutures or  staples will be removed at the office during your follow-up visit. 8. ACTIVITIES:  You may resume regular (light) daily activities beginning the next day--such as daily self-care, walking, climbing stairs--gradually increasing activities as tolerated.  You may have sexual intercourse when it is comfortable.  Refrain from any heavy lifting or straining until approved by your doctor. a. You may drive when you are no longer taking prescription pain medication, you can comfortably wear a seatbelt, and you can safely maneuver your car and apply brakes. b. RETURN TO WORK:  __________________________________________________________ 9. You should see your doctor in the office for a follow-up appointment approximately 2-3 weeks after your surgery.  Make sure that you call for this appointment within a day or two after you arrive home to insure a convenient appointment time. 10. OTHER INSTRUCTIONS:ICE PACK AND IBUPROFEN ALSO FOR PAIN\ 11. NO LIFTING MORE THAN 15 POUNDS FOR 2 WEEKS __________________________________________________________________________________________________________________________ __________________________________________________________________________________________________________________________ WHEN TO CALL YOUR DOCTOR: 1. Fever over 101.0 2. Inability to urinate 3. Continued bleeding from incision. 4. Increased pain, redness, or drainage from the incision. 5. Increasing abdominal pain  The clinic staff is available to answer your questions during regular business hours.  Please dont hesitate to call and ask to speak to one of the nurses for clinical concerns.  If you have a medical emergency, go to the nearest emergency room or call 911.  A surgeon from [facility] is always on call at the hospital. [address], [address], [place], [place]  [id number] ? [address] [place], [place]   [address] [phone] ? [phone] ? FAX [phone] Web site:  [email or web address]

## 2016-08-26 NOTE — Op Note (Signed)

## 2016-08-26 NOTE — Transfer of Care (Signed)
Immediate Anesthesia Transfer of Care Note  Patient: Mary Huynh  Procedure(s) Performed: Procedure(s): LAPAROSCOPIC CHOLECYSTECTOMY (N/A)  Patient Location: PACU  Anesthesia Type:General  Level of Consciousness: awake, alert , oriented and patient cooperative  Airway & Oxygen Therapy: Patient Spontanous Breathing and Patient connected to face mask oxygen  Post-op Assessment: Report given to RN and Post -op Vital signs reviewed and stable  Post vital signs: Reviewed and stable  Last Vitals:  Vitals:   08/26/16 1101 08/26/16 1319  BP: 121/78 123/67  Pulse: 67 81  Resp: 20 (!) 23  Temp: 37 C 36.3 C    Last Pain:  Vitals:   08/26/16 1319  TempSrc:   PainSc: 0-No pain         Complications: No apparent anesthesia complications

## 2016-08-26 NOTE — Anesthesia Procedure Notes (Signed)
Procedure Name: Intubation Date/Time: 08/26/2016 12:26 PM Performed by: Sharlene DoryWALKER, Mat Stuard E Pre-anesthesia Checklist: Patient identified, Emergency Drugs available, Suction available and Patient being monitored Patient Re-evaluated:Patient Re-evaluated prior to inductionOxygen Delivery Method: Circle System Utilized Preoxygenation: Pre-oxygenation with 100% oxygen Intubation Type: IV induction Ventilation: Mask ventilation without difficulty Laryngoscope Size: Mac and 4 Grade View: Grade III Tube type: Oral Number of attempts: 1 Airway Equipment and Method: Stylet and Oral airway Placement Confirmation: ETT inserted through vocal cords under direct vision,  positive ETCO2 and breath sounds checked- equal and bilateral Secured at: 21 cm Tube secured with: Tape Dental Injury: Teeth and Oropharynx as per pre-operative assessment

## 2016-08-26 NOTE — Anesthesia Preprocedure Evaluation (Signed)
Anesthesia Evaluation  Patient identified by MRN, date of birth, ID band Patient awake    Reviewed: Allergy & Precautions, H&P , Patient's Chart, lab work & pertinent test results, reviewed documented beta blocker date and time   Airway Mallampati: III  TM Distance: >3 FB Neck ROM: full    Dental no notable dental hx.    Pulmonary    Pulmonary exam normal breath sounds clear to auscultation       Cardiovascular hypertension, On Medications  Rhythm:regular Rate:Normal     Neuro/Psych    GI/Hepatic   Endo/Other  Morbid obesity  Renal/GU      Musculoskeletal   Abdominal   Peds  Hematology   Anesthesia Other Findings   Reproductive/Obstetrics                             Anesthesia Physical Anesthesia Plan  ASA: III  Anesthesia Plan: General   Post-op Pain Management:    Induction: Intravenous  Airway Management Planned: Oral ETT  Additional Equipment:   Intra-op Plan:   Post-operative Plan: Extubation in OR  Informed Consent: I have reviewed the patients History and Physical, chart, labs and discussed the procedure including the risks, benefits and alternatives for the proposed anesthesia with the patient or authorized representative who has indicated his/her understanding and acceptance.   Dental Advisory Given and Dental advisory given  Plan Discussed with: CRNA and Surgeon  Anesthesia Plan Comments: (  Discussed general anesthesia, including possible nausea, instrumentation of airway, sore throat,pulmonary aspiration, etc. I asked if the were any outstanding questions, or  concerns before we proceeded. )        Anesthesia Quick Evaluation

## 2016-08-27 ENCOUNTER — Encounter (HOSPITAL_COMMUNITY): Payer: Self-pay | Admitting: Surgery

## 2016-08-28 NOTE — Anesthesia Postprocedure Evaluation (Signed)
Anesthesia Post Note  Patient: Mary Huynh  Procedure(s) Performed: Procedure(s) (LRB): LAPAROSCOPIC CHOLECYSTECTOMY (N/A)  Patient location during evaluation: PACU Anesthesia Type: General Level of consciousness: sedated Pain management: satisfactory to patient Vital Signs Assessment: post-procedure vital signs reviewed and stable Respiratory status: spontaneous breathing Cardiovascular status: stable Anesthetic complications: no    Last Vitals:  Vitals:   08/26/16 1504 08/26/16 1530  BP: (!) 104/46 124/66  Pulse: 73 83  Resp: 19 20  Temp:      Last Pain:  Vitals:   08/26/16 1530  TempSrc:   PainSc: 3                  Chelsea Nusz EDWARD

## 2019-08-07 ENCOUNTER — Encounter (HOSPITAL_BASED_OUTPATIENT_CLINIC_OR_DEPARTMENT_OTHER): Payer: Self-pay | Admitting: Emergency Medicine

## 2019-08-07 ENCOUNTER — Other Ambulatory Visit: Payer: Self-pay

## 2019-08-07 ENCOUNTER — Emergency Department (HOSPITAL_BASED_OUTPATIENT_CLINIC_OR_DEPARTMENT_OTHER): Payer: Managed Care, Other (non HMO)

## 2019-08-07 ENCOUNTER — Emergency Department (HOSPITAL_BASED_OUTPATIENT_CLINIC_OR_DEPARTMENT_OTHER)
Admission: EM | Admit: 2019-08-07 | Discharge: 2019-08-07 | Disposition: A | Discharge status: Home / Self Care | Payer: Managed Care, Other (non HMO) | Attending: Emergency Medicine | Admitting: Emergency Medicine

## 2019-08-07 DIAGNOSIS — I10 Essential (primary) hypertension: Secondary | ICD-10-CM | POA: Diagnosis not present

## 2019-08-07 DIAGNOSIS — M546 Pain in thoracic spine: Secondary | ICD-10-CM | POA: Insufficient documentation

## 2019-08-07 DIAGNOSIS — R0789 Other chest pain: Secondary | ICD-10-CM | POA: Diagnosis not present

## 2019-08-07 DIAGNOSIS — Z20828 Contact with and (suspected) exposure to other viral communicable diseases: Secondary | ICD-10-CM | POA: Insufficient documentation

## 2019-08-07 DIAGNOSIS — E876 Hypokalemia: Secondary | ICD-10-CM | POA: Diagnosis not present

## 2019-08-07 DIAGNOSIS — Z79899 Other long term (current) drug therapy: Secondary | ICD-10-CM | POA: Diagnosis not present

## 2019-08-07 LAB — BASIC METABOLIC PANEL
Anion gap: 9 (ref 5–15)
BUN: 11 mg/dL (ref 6–20)
CO2: 24 mmol/L (ref 22–32)
Calcium: 8.5 mg/dL — ABNORMAL LOW (ref 8.9–10.3)
Chloride: 106 mmol/L (ref 98–111)
Creatinine, Ser: 0.74 mg/dL (ref 0.44–1.00)
GFR calc Af Amer: >60 mL/min (ref >60)
GFR calc non Af Amer: >60 mL/min (ref >60)
Glucose, Bld: 95 mg/dL (ref 70–99)
Potassium: 3.2 mmol/L — ABNORMAL LOW (ref 3.5–5.1)
Sodium: 139 mmol/L (ref 135–145)

## 2019-08-07 LAB — CBC
HCT: 43.2 % (ref 36.0–46.0)
Hemoglobin: 13.8 g/dL (ref 12.0–15.0)
MCH: 29.5 pg (ref 26.0–34.0)
MCHC: 31.9 g/dL (ref 30.0–36.0)
MCV: 92.3 fL (ref 80.0–100.0)
Platelets: 214 10*3/uL (ref 150–400)
RBC: 4.68 MIL/uL (ref 3.87–5.11)
RDW: 13.2 % (ref 11.5–15.5)
WBC: 5.9 10*3/uL (ref 4.0–10.5)
nRBC: 0 % (ref 0.0–0.2)

## 2019-08-07 LAB — URINALYSIS, ROUTINE W REFLEX MICROSCOPIC
Bilirubin Urine: NEGATIVE
Glucose, UA: NEGATIVE mg/dL
Ketones, ur: NEGATIVE mg/dL
Leukocytes,Ua: NEGATIVE
Nitrite: NEGATIVE
Protein, ur: NEGATIVE mg/dL
Specific Gravity, Urine: 1.025 (ref 1.005–1.030)
pH: 5 (ref 5.0–8.0)

## 2019-08-07 LAB — URINALYSIS, MICROSCOPIC (REFLEX)

## 2019-08-07 LAB — D-DIMER, QUANTITATIVE (NOT AT ARMC): D-Dimer, Quant: 0.52 ug/mL-FEU — ABNORMAL HIGH (ref 0.00–0.50)

## 2019-08-07 LAB — TROPONIN I (HIGH SENSITIVITY): Troponin I (High Sensitivity): 2 ng/L (ref <18)

## 2019-08-07 MED ORDER — IOHEXOL 350 MG/ML SOLN
100.0000 mL | Freq: Once | INTRAVENOUS | Status: AC | PRN
  Administered 2019-08-07: 100 mL via INTRAVENOUS

## 2019-08-07 MED ORDER — KETOROLAC TROMETHAMINE 15 MG/ML IJ SOLN
15.0000 mg | Freq: Once | INTRAMUSCULAR | Status: DC
  Filled 2019-08-07: qty 1

## 2019-08-07 NOTE — ED Triage Notes (Signed)
Patient states that she is having bilateral lower back and flank pain for the past 2 weeks - she has gone to her PMD x 2 with no answers. Reports that it is worse after eating and when taking a deep breath

## 2019-08-07 NOTE — ED Provider Notes (Signed)
[facility] Provider Note   CSN: [id number] Arrival date & time: 08/07/19  1707     History   Chief Complaint Chief Complaint  Patient presents with   Back Pain    HPI Mary Huynh is a 48 y.o. female who presents with back pain.  Patient states that at the beginning of the month she was having some bilateral mid back pain.  She told her doctor about it and they did a urine test and told her it may be related to large breasts. They told her the urine was normal but she states that they did not tell her why she was having back pain.  She is scheduled for a CT scan of her abdomen on November 30.  She feels like her symptoms are worsening and she cannot deal with the pain anymore.  She has been taking ibuprofen for pain with minimal relief.  The pain is primarily over the bilateral lower ribs and wraps around anteriorly.  She reports worsening pain with deep breaths.  She is also had nasal congestion, runny nose, and a dry cough.  She denies fever, chills, body aches, chest pain, shortness of breath, nausea, vomiting, diarrhea, abdominal pain, urinary symptoms.  She has not had any low back pain.  She has been around a coworker who tested positive for Covid earlier this month. No recent surgery/travel/immobilization, hx of cancer, unilateral leg swelling, hemoptysis, prior DVT/PE, or hormone use.     HPI  Past Medical History:  Diagnosis Date   Anxiety    Depression    GERD (gastroesophageal reflux disease)    Headache    Hypertension     There are no active problems to display for this patient.   Past Surgical History:  Procedure Laterality Date   ABDOMINAL HYSTERECTOMY     CHOLECYSTECTOMY N/A 08/26/2016   Procedure: LAPAROSCOPIC CHOLECYSTECTOMY;  Surgeon: Coralie Keens, MD;  Location: ;  Service: General;  Laterality: N/A;     OB History   No obstetric history on file.      Home Medications    Prior to Admission  medications   Medication Sig Start Date End Date Taking? Authorizing Provider  enalapril-hydrochlorothiazide (VASERETIC) 10-25 MG tablet Take 1 tablet by mouth daily.    [provider]  estradiol (ESTRACE) 1 MG tablet Take 1 mg by mouth daily.    [provider]  hydroxypropyl methylcellulose / hypromellose (ISOPTO TEARS / GONIOVISC) 2.5 % ophthalmic solution Place 1 drop into both eyes 4 (four) times daily as needed for dry eyes.    [provider]  loratadine (CLARITIN) 10 MG tablet Take 10 mg by mouth daily as needed for allergies.    [provider]  lubiprostone (AMITIZA) 8 MCG capsule Take 8 mcg by mouth daily.    [provider]  Magnesium 250 MG TABS Take 250 mg by mouth daily.    [provider]  oxyCODONE-acetaminophen (ROXICET) 5-325 MG tablet Take 1-2 tablets by mouth every 4 (four) hours as needed for moderate pain or severe pain. 08/26/16   Coralie Keens, MD  PARoxetine (PAXIL) 20 MG tablet Take 20 mg by mouth daily.    [provider]  potassium chloride SA (K-DUR,KLOR-CON) 20 MEQ tablet Take 20 mEq by mouth daily.    [provider]  tinidazole (TINDAMAX) 500 MG tablet Take 2,000 mg by mouth daily with breakfast. For 2 days 08/20/16   [provider]  traMADol (ULTRAM) 50 MG tablet  Take 50 mg by mouth at bedtime as needed (for gallblaadder pain.).    [provider]    Family History History reviewed. No pertinent family history.  Social History Social History   Tobacco Use   Smoking status: Never Smoker   Smokeless tobacco: Never Used  Substance Use Topics   Alcohol use: Yes    Comment: occasionally   Drug use: No     Allergies   No known allergies   Review of Systems Review of Systems  Constitutional: Negative for chills and fever.  HENT: Positive for congestion and rhinorrhea. Negative for postnasal drip.   Respiratory: Positive for cough. Negative for shortness  of breath.   Cardiovascular: Positive for chest pain (rib).  Gastrointestinal: Negative for abdominal pain, diarrhea, nausea and vomiting.  Genitourinary: Negative for dysuria, flank pain and pelvic pain.  Musculoskeletal: Positive for back pain.  All other systems reviewed and are negative.    Physical Exam Updated Vital Signs BP (!) 144/93 (BP Location: Left Arm)    Pulse 83    Temp 99.1 F (37.3 C) (Oral)    Resp 18    Ht 5\' 8"  (1.727 m)    Wt (!) 141.1 kg    SpO2 100%    BMI 47.29 kg/m   Physical Exam Vitals signs and nursing note reviewed.  Constitutional:      General: She is not in acute distress.    Appearance: Normal appearance. She is well-developed. She is not ill-appearing.  HENT:     Head: Normocephalic and atraumatic.  Eyes:     General: No scleral icterus.       Right eye: No discharge.        Left eye: No discharge.     Conjunctiva/sclera: Conjunctivae normal.     Pupils: Pupils are equal, round, and reactive to light.  Neck:     Musculoskeletal: Normal range of motion.  Cardiovascular:     Rate and Rhythm: Normal rate and regular rhythm.  Pulmonary:     Effort: Pulmonary effort is normal. No respiratory distress.     Breath sounds: Normal breath sounds.  Chest:     Chest wall: Tenderness (tenderness over the lower ribs bilaterally) present.  Abdominal:     General: There is no distension.     Palpations: Abdomen is soft.     Tenderness: There is no abdominal tenderness.  Musculoskeletal:     Comments: Tenderness along the lower aspect of the posterior ribs bilaterally. No midline tenderness of the thoracic spine.  Skin:    General: Skin is warm and dry.  Neurological:     Mental Status: She is alert and oriented to person, place, and time.  Psychiatric:        Behavior: Behavior normal.      ED Treatments / Results  Labs (all labs ordered are listed, but only abnormal results are displayed) Labs Reviewed  URINALYSIS, ROUTINE W REFLEX  MICROSCOPIC - Abnormal; Notable for the following components:      Result Value   Hgb urine dipstick TRACE (*)    All other components within normal limits  URINALYSIS, MICROSCOPIC (REFLEX) - Abnormal; Notable for the following components:   Bacteria, UA FEW (*)    All other components within normal limits  BASIC METABOLIC PANEL - Abnormal; Notable for the following components:   Potassium 3.2 (*)    Calcium 8.5 (*)    All other components within normal limits  D-DIMER, QUANTITATIVE (NOT [facility]) -  Abnormal; Notable for the following components:   D-Dimer, Quant 0.52 (*)    All other components within normal limits  NOVEL CORONAVIRUS, NAA (HOSP ORDER, SEND-OUT TO REF LAB; TAT 18-24 HRS)  CBC  TROPONIN I (HIGH SENSITIVITY)  TROPONIN I (HIGH SENSITIVITY)    EKG EKG Interpretation  Date/Time:  Saturday August 07 2019 19:04:37 EST Ventricular Rate:  71 PR Interval:    QRS Duration: 93 QT Interval:  400 QTC Calculation: 435 R Axis:   6 Text Interpretation: Sinus rhythm Atrial premature complexes Borderline T abnormalities, anterior leads No significant change since last tracing Confirmed by Richardean Canal [phone]) on 08/07/2019 8:58:02 PM   Radiology Ct Angio Chest Pe W/cm &/or Wo Cm  Result Date: 08/07/2019 CLINICAL DATA:  Complex chest pain EXAM: CT ANGIOGRAPHY CHEST WITH CONTRAST TECHNIQUE: Multidetector CT imaging of the chest was performed using the standard protocol during bolus administration of intravenous contrast. Multiplanar CT image reconstructions and MIPs were obtained to evaluate the vascular anatomy. CONTRAST:  OMNIPAQUE IOHEXOL 350 MG/ML SOLN COMPARISON:  None. FINDINGS: Cardiovascular: Evaluation is limited by contrast bolus timing. Given this limitation, there is no large centrally located pulmonary embolism. Detection of smaller segmental and subsegmental pulmonary emboli is significantly limited. There is no thoracic aortic aneurysm. Detection of a thoracic  aortic dissection is limited by contrast bolus timing. Mediastinum/Nodes: --No mediastinal or hilar lymphadenopathy. --No axillary lymphadenopathy. --No supraclavicular lymphadenopathy. --Normal thyroid gland. --The esophagus is unremarkable Lungs/Pleura: No pulmonary nodules or masses. No pleural effusion or pneumothorax. No focal airspace consolidation. No focal pleural abnormality. Upper Abdomen: No acute abnormality. Musculoskeletal: No chest wall abnormality. No acute or significant osseous findings. Review of the MIP images confirms the above findings. IMPRESSION: 1. Evaluation is limited by contrast bolus timing. Given this limitation, there is no large centrally located pulmonary embolism. Detection of smaller segmental and subsegmental pulmonary emboli is significantly limited. 2. No acute intrathoracic process. Electronically Signed   By: Katherine Mantle M.D.   On: 08/07/2019 20:49   Dg Chest Port 1 View  Result Date: 08/07/2019 CLINICAL DATA:  Dry cough for 2 weeks EXAM: PORTABLE CHEST 1 VIEW COMPARISON:  02/14/2014 FINDINGS: The heart size and mediastinal contours are within normal limits. Both lungs are clear. The visualized skeletal structures are unremarkable. IMPRESSION: No active disease. Electronically Signed   By: Jasmine Pang M.D.   On: 08/07/2019 19:02    Procedures Procedures (including critical care time)  Medications Ordered in ED Medications  iohexol (OMNIPAQUE) 350 MG/ML injection 100 mL (100 mLs Intravenous Contrast Given 08/07/19 2006)     Initial Impression / Assessment and Plan / ED Course  I have reviewed the triage vital signs and the nursing notes.  Pertinent labs & imaging results that were available during my care of the patient were reviewed by me and considered in my medical decision making (see chart for details).  48 year old female presents with bilateral mid back pain is been ongoing for several weeks.  She reports associated URI symptoms and cough  but states this might be due to allergies.  Her vital signs are normal.  She is tender over the posterior and anterior lower rib cage.  She has no lower back or abdominal tenderness.  No CVA tenderness.  UA has trace hemoglobin but otherwise is reassuring. DDx: pneumonia, PE, MSK back pain  Will obtain EKG, labs, chest x-ray, Covid swab  EKG is sinus rhythm.  Labs show hypokalemia which is a chronic issue for her.  D-dimer is minimally elevated.  Chest x-ray is normal.  Will obtain CTA of the chest  CT is negative for large central PE although contrast timing was suboptimal.  With minimally elevated D-dimer it is unlikely that a small PE is the cause of her symptoms.  All results were discussed with the patient.  She is advised to quarantine until she gets the results of her test. She was advised to f/u with orthopedics for back pain. She verbalized understanding  Final Clinical Impressions(s) / ED Diagnoses   Final diagnoses:  Acute bilateral thoracic back pain  Hypokalemia    ED Discharge Orders    None       Bethel Born, PA-C 08/07/19 2242    Charlynne Pander, MD 08/08/19 1500

## 2019-08-07 NOTE — ED Notes (Signed)
Patient in CT

## 2019-08-07 NOTE — Discharge Instructions (Signed)
Please follow up with orthopedics Continue Ibuprofen or Tylenol as needed for pain Return if worsening

## 2019-08-07 NOTE — ED Notes (Addendum)
Pt also reports dry cough and sinus congestion x 2 weeks. Pt reports coworker tested positive for Covid Nov. 11 and was exposed. Pt denies fever

## 2019-08-07 NOTE — ED Notes (Signed)
Pt acknowledged understanding d/c instructions

## 2019-08-09 LAB — NOVEL CORONAVIRUS, NAA (HOSP ORDER, SEND-OUT TO REF LAB; TAT 18-24 HRS): SARS-CoV-2, NAA: NOT DETECTED

## 2019-08-20 ENCOUNTER — Other Ambulatory Visit: Payer: Self-pay | Admitting: Gastroenterology

## 2019-08-20 DIAGNOSIS — R1011 Right upper quadrant pain: Secondary | ICD-10-CM

## 2019-08-20 DIAGNOSIS — R110 Nausea: Secondary | ICD-10-CM

## 2019-09-01 ENCOUNTER — Other Ambulatory Visit: Payer: Self-pay | Admitting: Gastroenterology

## 2019-09-01 DIAGNOSIS — R110 Nausea: Secondary | ICD-10-CM

## 2019-09-01 DIAGNOSIS — R1011 Right upper quadrant pain: Secondary | ICD-10-CM

## 2019-09-02 ENCOUNTER — Ambulatory Visit
Admission: RE | Admit: 2019-09-02 | Discharge: 2019-09-02 | Disposition: A | Discharge status: Home / Self Care | Payer: Managed Care, Other (non HMO) | Source: Ambulatory Visit | Attending: Gastroenterology | Admitting: Gastroenterology

## 2019-09-02 DIAGNOSIS — R1011 Right upper quadrant pain: Secondary | ICD-10-CM

## 2019-09-02 DIAGNOSIS — R110 Nausea: Secondary | ICD-10-CM

## 2019-09-06 ENCOUNTER — Other Ambulatory Visit: Payer: Managed Care, Other (non HMO)

## 2020-05-26 IMAGING — CT CT ANGIO CHEST
3 of 15 series · 12 of 37 positions shown · IV contrast (omnipaque)
Comparison: None.

CLINICAL DATA: Complex chest pain

EXAM:
CT ANGIOGRAPHY CHEST WITH CONTRAST
TECHNIQUE: Multidetector CT imaging of the chest was performed using the
standard protocol during bolus administration of intravenous
contrast. Multiplanar CT image reconstructions and MIPs were
obtained to evaluate the vascular anatomy.
CONTRAST:  100mL OMNIPAQUE IOHEXOL 350 MG/ML SOLN

[Series 6: pe axial st · axial · 0.62mm/px · z∈[+993,+1080]mm · 2 of 89 slices shown (1 of 2)]
[im 30/89  lung]
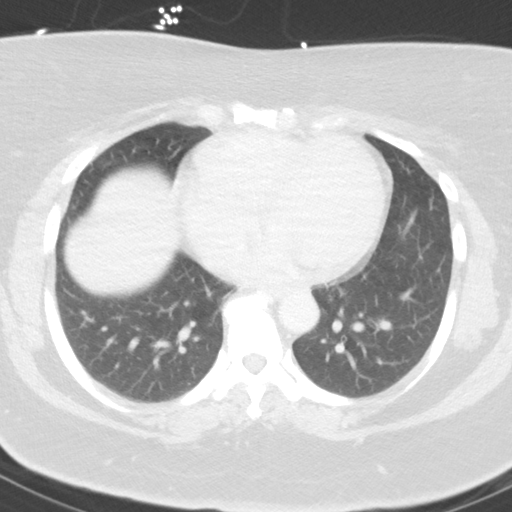
[im 59/89  mediastinal]
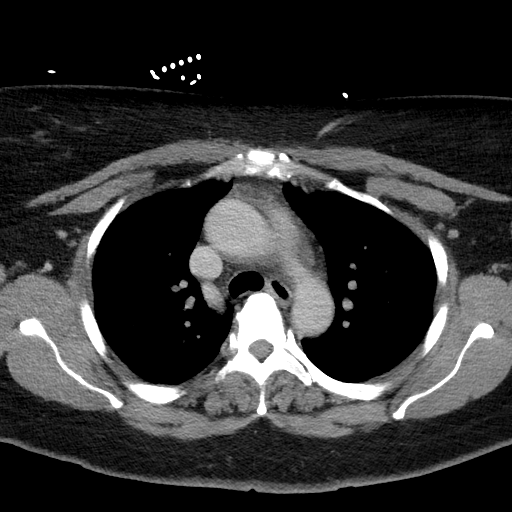

[Series 12: pe axial st · axial · 0.63mm/px · z∈[+940,+1024]mm · 2 of 85 slices shown (2 of 2)]
[im 29/85  lung]
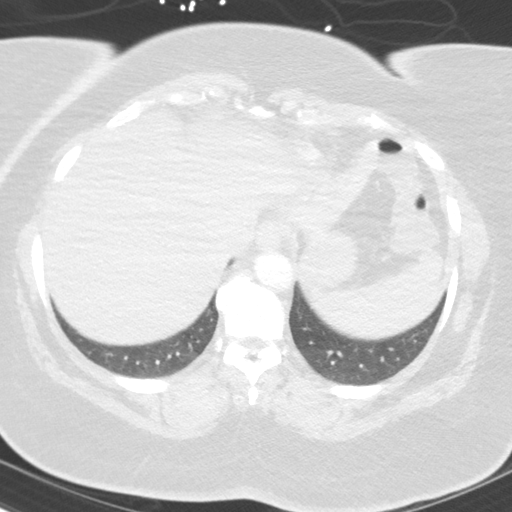
[im 57/85  lung]
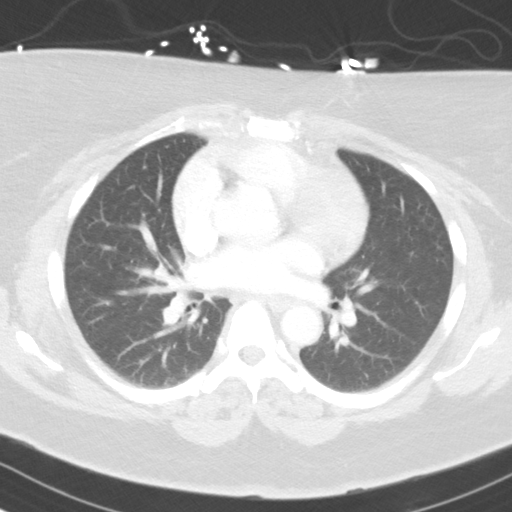

[Series 18: pe thins · axial · 0.63mm/px · z∈[+873,+1088]mm · 8 of 255 slices shown]
[im 20/255  lung]
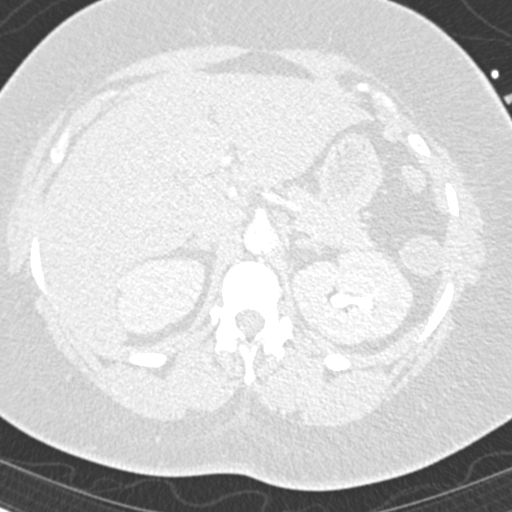
[im 59/255  lung]
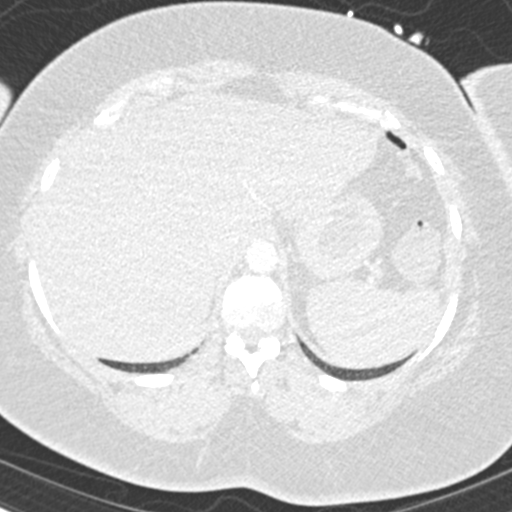
[im 79/255  lung]
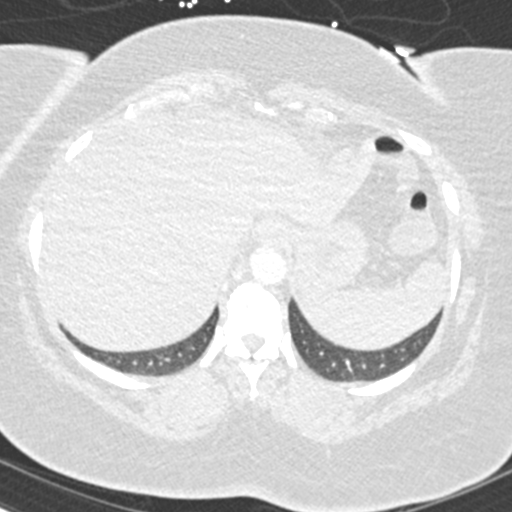
[im 118/255  lung]
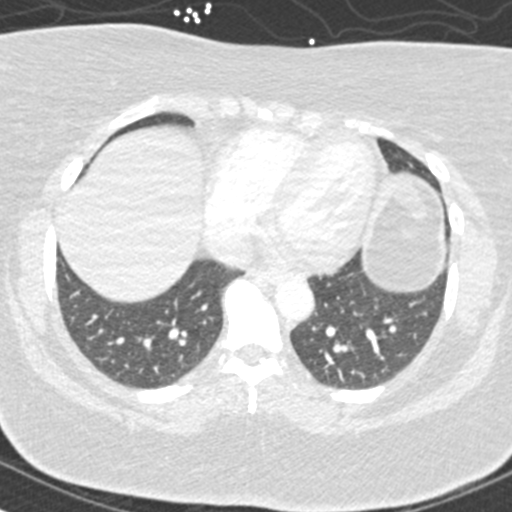
[im 137/255  lung]
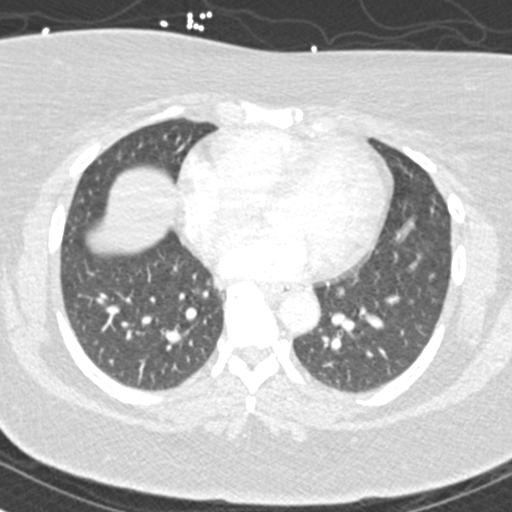
[im 176/255  lung]
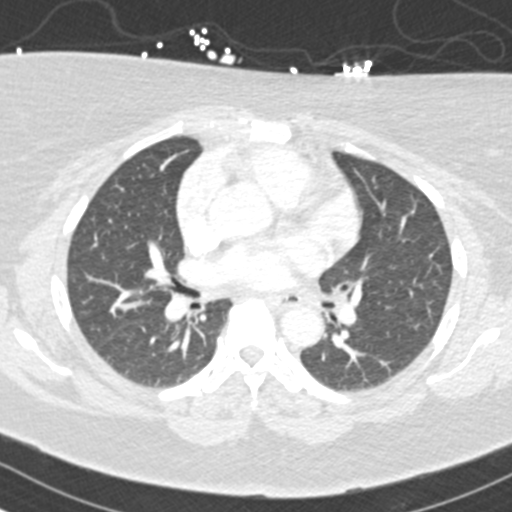
[im 196/255  lung]
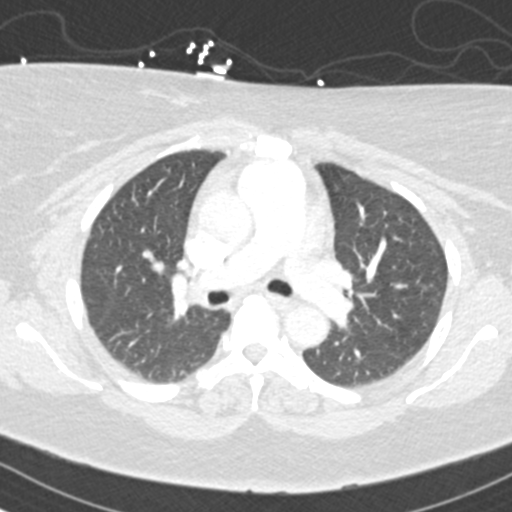
[im 235/255  lung]
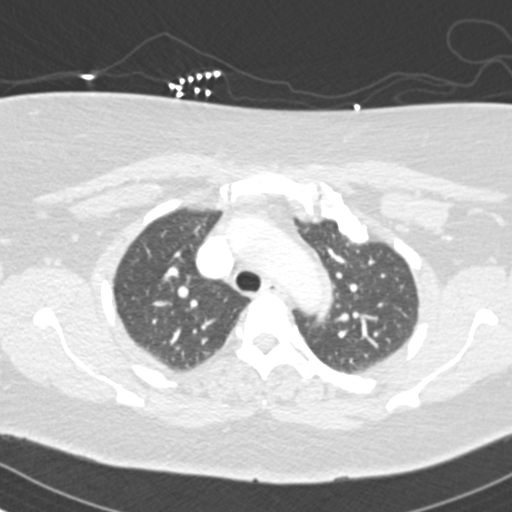

[12 of 37 positions shown; findings below may reference images not displayed]

FINDINGS: Cardiovascular: Evaluation is limited by contrast bolus timing.
Given this limitation, there is no large centrally located pulmonary
embolism. Detection of smaller segmental and subsegmental pulmonary
emboli is significantly limited. There is no thoracic aortic
aneurysm. Detection of a thoracic aortic dissection is limited by
contrast bolus timing.

Mediastinum/Nodes:

--No mediastinal or hilar lymphadenopathy.

--No axillary lymphadenopathy.

--No supraclavicular lymphadenopathy.

--Normal thyroid gland.

--The esophagus is unremarkable

Lungs/Pleura: No pulmonary nodules or masses. No pleural effusion or
pneumothorax. No focal airspace consolidation. No focal pleural
abnormality.

Upper Abdomen: No acute abnormality.

Musculoskeletal: No chest wall abnormality. No acute or significant
osseous findings.

Review of the MIP images confirms the above findings.
IMPRESSION: 1. Evaluation is limited by contrast bolus timing. Given this
limitation, there is no large centrally located pulmonary embolism.
Detection of smaller segmental and subsegmental pulmonary emboli is
significantly limited.
2. No acute intrathoracic process.

## 2020-05-26 IMAGING — DX DG CHEST 1V PORT
1 series · 1 of 1 positions shown · non-contrast
Comparison: 02/14/2014

CLINICAL DATA: Dry cough for 2 weeks

EXAM:
PORTABLE CHEST 1 VIEW

[chest ap]
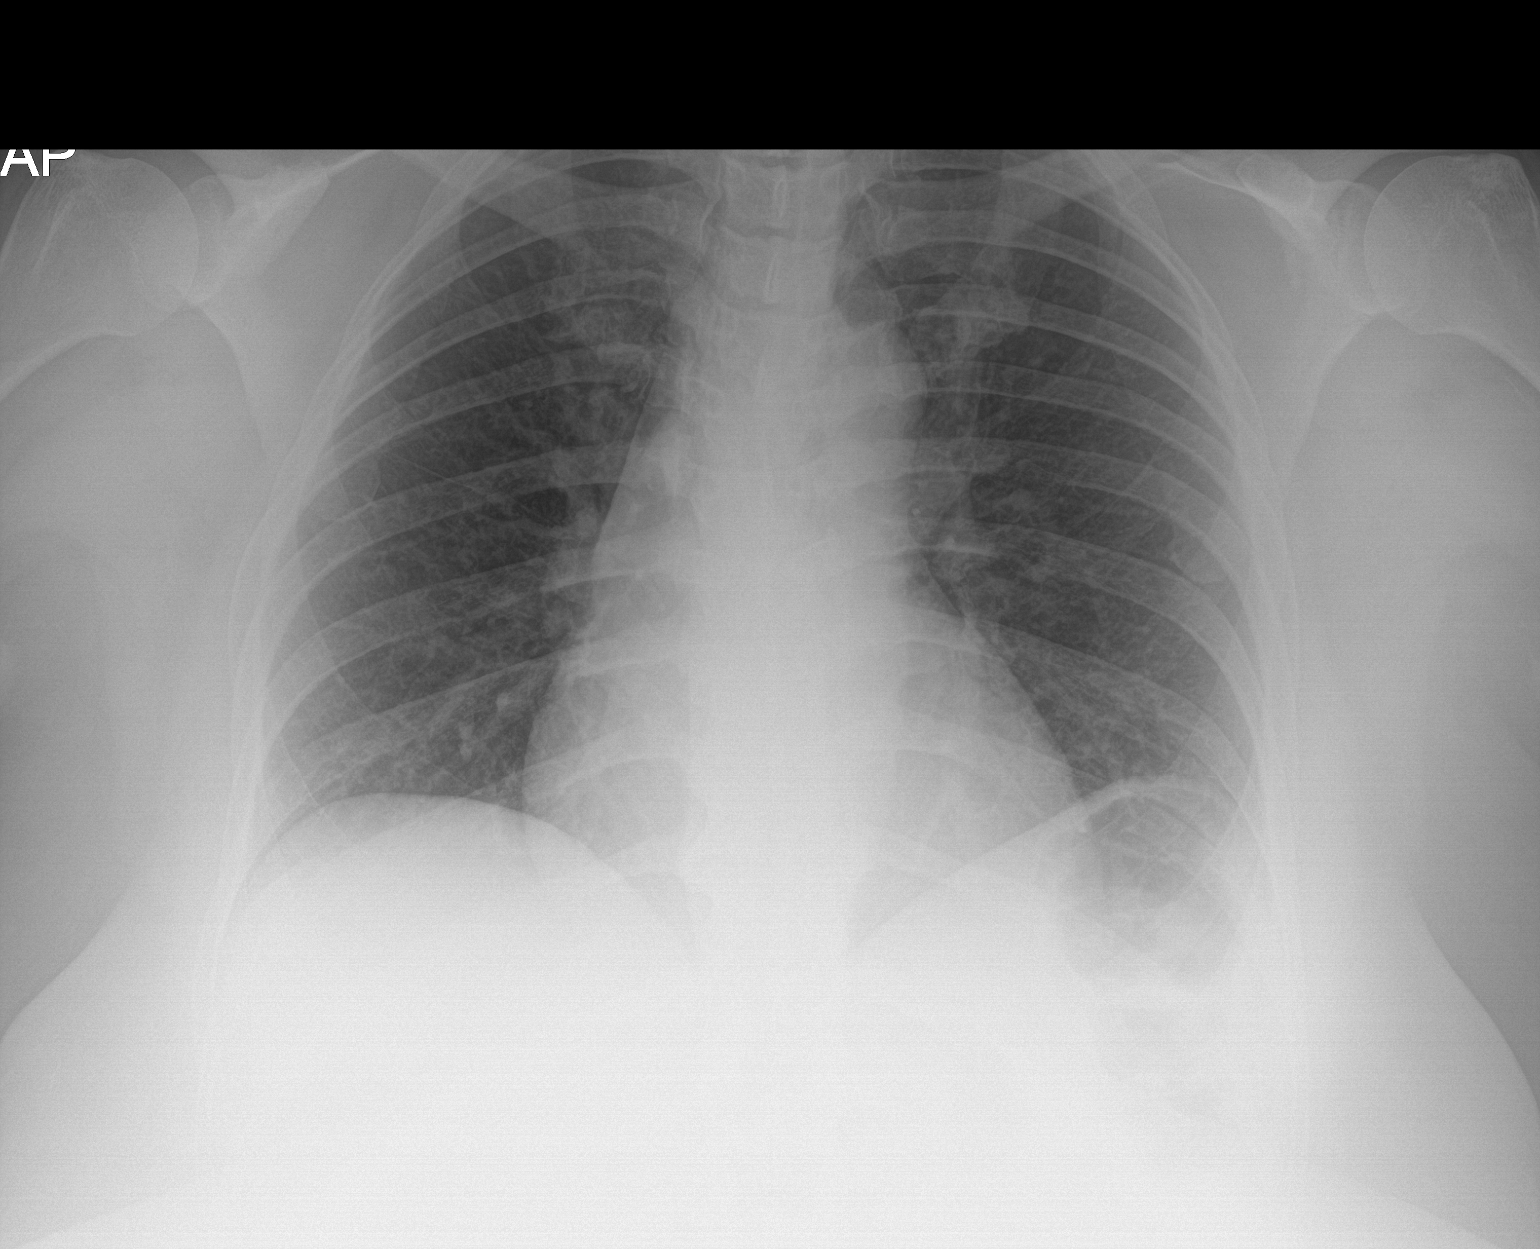

[1 of 1 positions shown; findings below may reference images not displayed]

FINDINGS: The heart size and mediastinal contours are within normal limits.
Both lungs are clear. The visualized skeletal structures are
unremarkable.
IMPRESSION: No active disease.

## 2020-06-21 IMAGING — US US ABDOMEN LIMITED
1 series · 14 of 25 positions shown · non-contrast
Comparison: None.

CLINICAL DATA: Upper abdominal pain

EXAM:
ULTRASOUND ABDOMEN LIMITED RIGHT UPPER QUADRANT

[Series 1: us abdomen limited · 0.31mm/px · 14 of 30 slices shown]
[im 1/30]
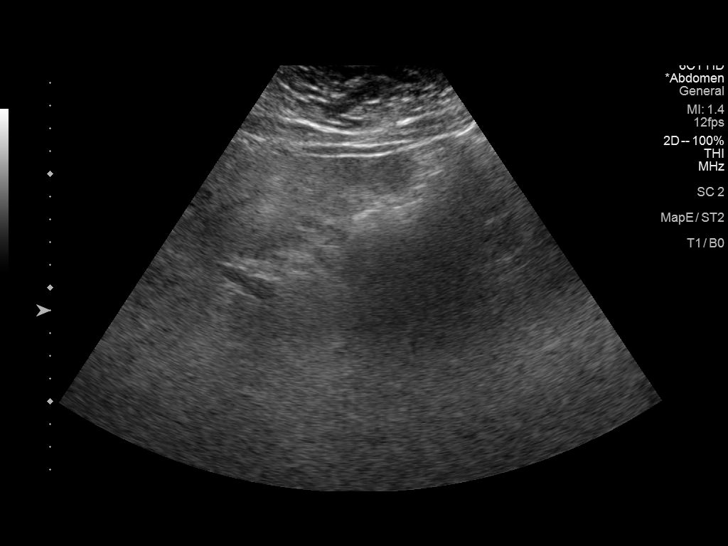
[im 3/30]
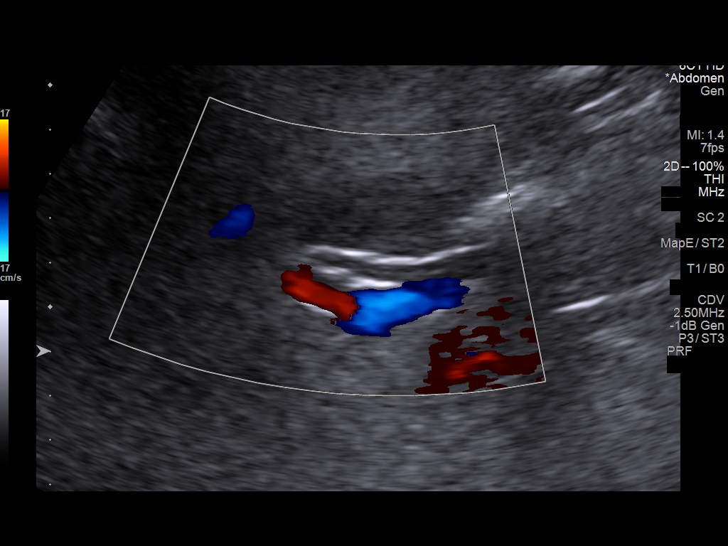
[im 5/30]
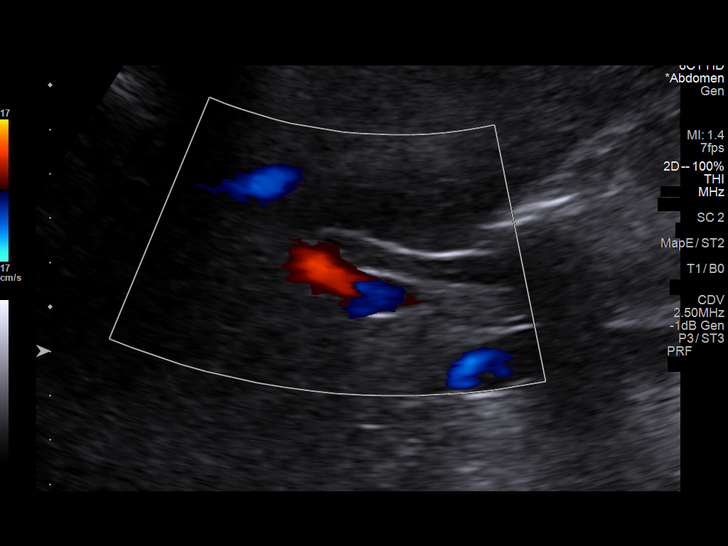
[im 8/30]
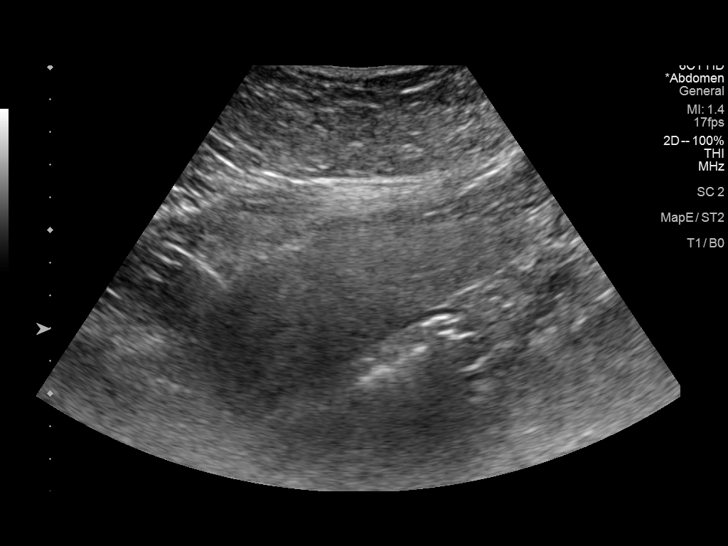
[im 10/30]
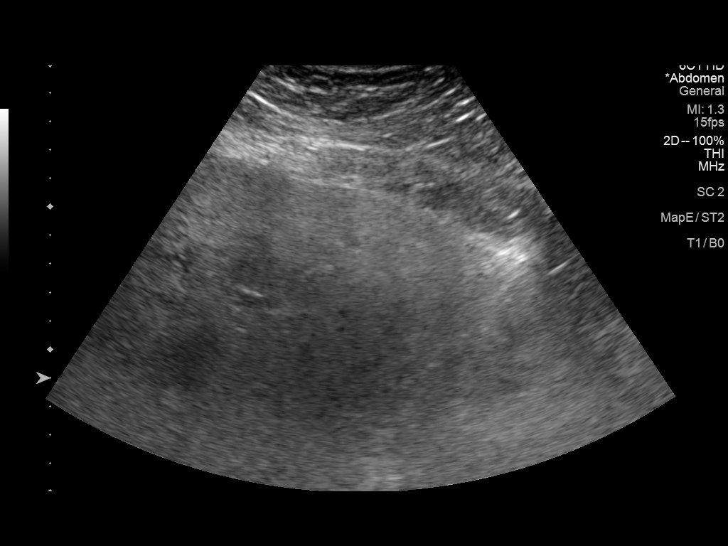
[im 11/30]
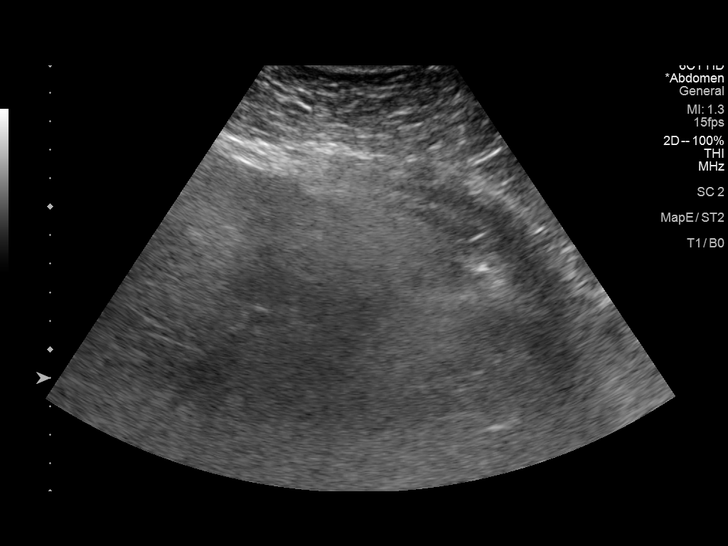
[im 14/30]
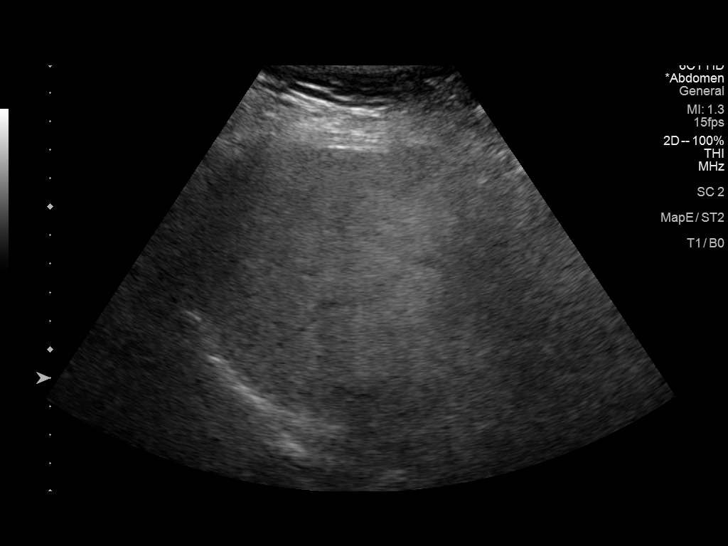
[im 16/30]
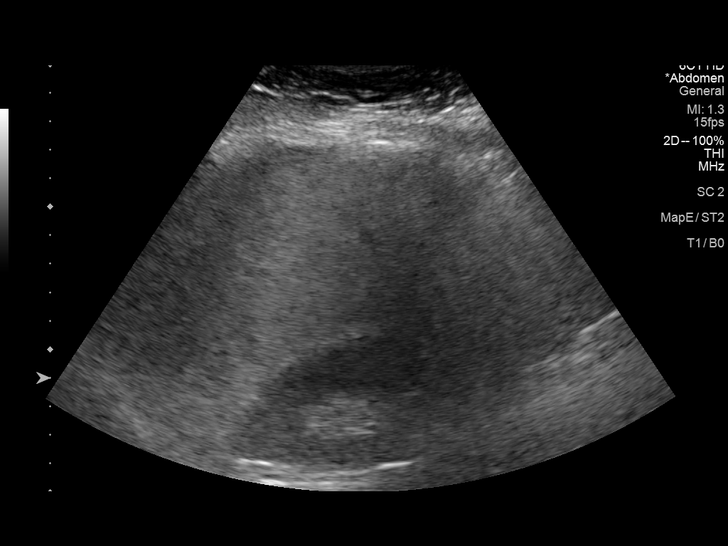
[im 19/30]
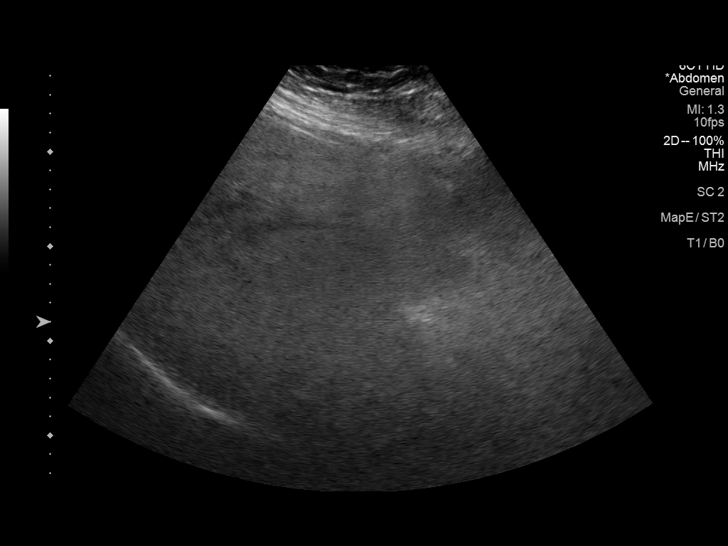
[im 20/30]
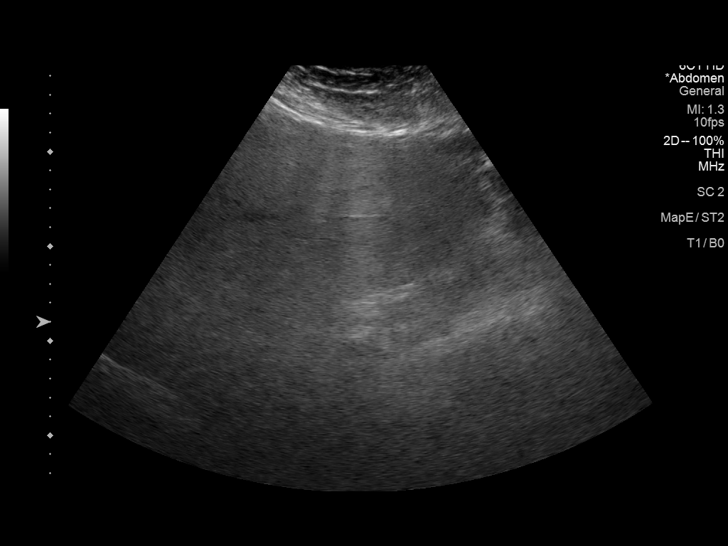
[im 22/30]
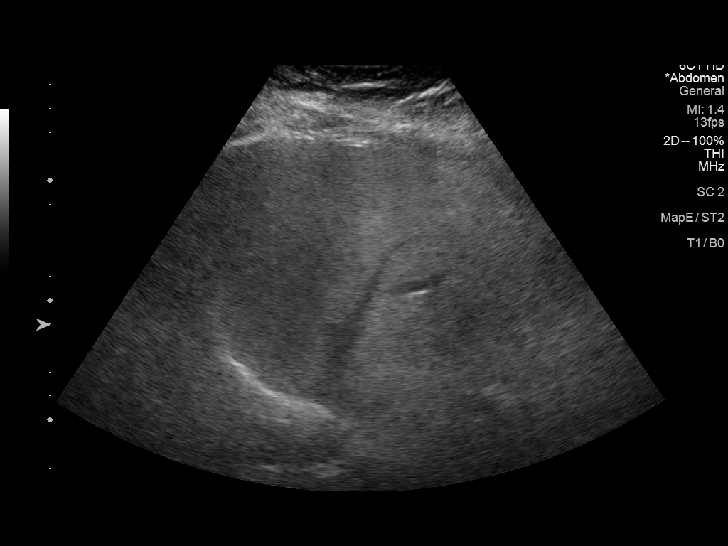
[im 25/30]
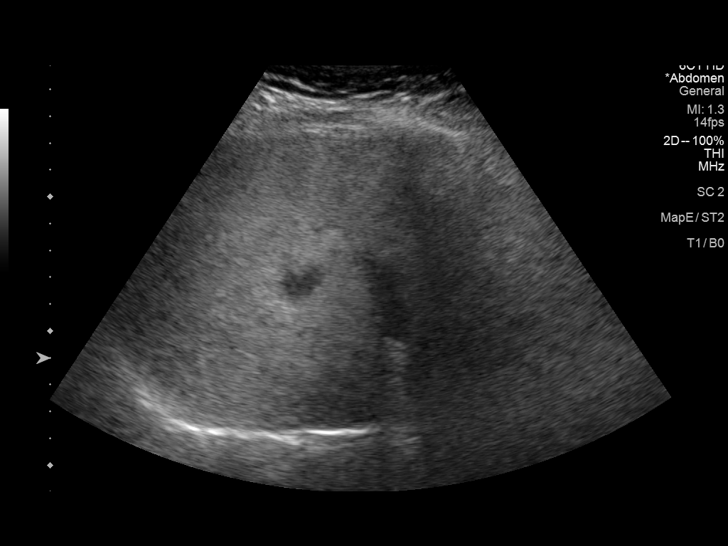
[im 27/30]
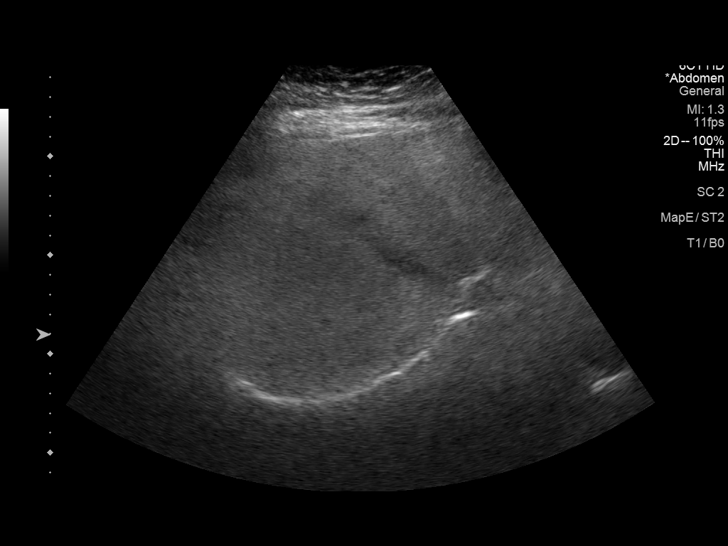
[im 30/30]
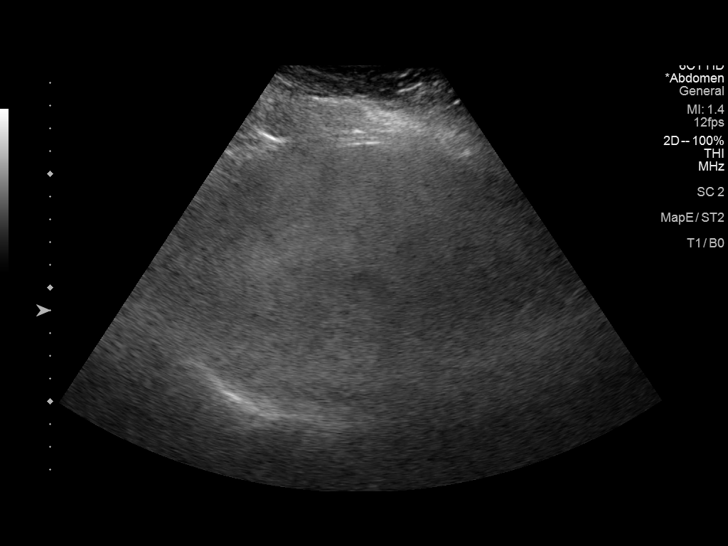

[14 of 25 positions shown; findings below may reference images not displayed]

FINDINGS: Gallbladder:

Surgically absent.

Common bile duct:

Diameter: 4 mm. No intrahepatic or extrahepatic biliary duct
dilatation.

Liver:

No focal lesion identified. Liver echogenicity is increased
diffusely. Portal vein is patent on color Doppler imaging with
normal direction of blood flow towards the liver.

Other: None.
IMPRESSION: 1. Diffuse increase in liver echogenicity, a finding indicative of
hepatic steatosis. While no focal liver lesions are evident on this
study, it must be cautioned that the sensitivity of ultrasound for
detection of focal liver lesions is diminished in this circumstance.

2.  Gallbladder absent.
